# Patient Record
Sex: Male | Born: 1950
Health system: Southern US, Community
[De-identification: ages and names within clinical notes are randomized; demographics above are authoritative.]

## PROBLEM LIST (undated history)

## (undated) DIAGNOSIS — N189 Chronic kidney disease, unspecified: Secondary | ICD-10-CM

## (undated) DIAGNOSIS — T7840XA Allergy, unspecified, initial encounter: Secondary | ICD-10-CM

## (undated) DIAGNOSIS — E785 Hyperlipidemia, unspecified: Secondary | ICD-10-CM

## (undated) DIAGNOSIS — I10 Essential (primary) hypertension: Secondary | ICD-10-CM

## (undated) HISTORY — DX: Essential (primary) hypertension: I10

## (undated) HISTORY — DX: Chronic kidney disease, unspecified: N18.9

## (undated) HISTORY — PX: COLONOSCOPY: SHX174

## (undated) HISTORY — PX: TONSILLECTOMY: SUR1361

## (undated) HISTORY — DX: Hyperlipidemia, unspecified: E78.5

## (undated) HISTORY — DX: Allergy, unspecified, initial encounter: T78.40XA

## (undated) HISTORY — PX: OTHER SURGICAL HISTORY: SHX169

---

## 2005-10-01 ENCOUNTER — Ambulatory Visit: Payer: Self-pay | Admitting: Family Medicine

## 2006-05-07 ENCOUNTER — Ambulatory Visit: Payer: Self-pay | Admitting: Family Medicine

## 2006-05-21 ENCOUNTER — Ambulatory Visit: Payer: Self-pay | Admitting: Family Medicine

## 2006-06-14 ENCOUNTER — Ambulatory Visit: Payer: Self-pay | Admitting: Family Medicine

## 2007-08-27 ENCOUNTER — Ambulatory Visit: Payer: Self-pay | Admitting: Family Medicine

## 2007-08-29 LAB — CONVERTED CEMR LAB
ALT: 19 units/L (ref 0–53)
AST: 21 units/L (ref 0–37)
Albumin: 4 g/dL (ref 3.5–5.2)
Basophils Absolute: 0 10*3/uL (ref 0.0–0.1)
Calcium: 9.4 mg/dL (ref 8.4–10.5)
Chloride: 106 meq/L (ref 96–112)
Eosinophils Absolute: 0.2 10*3/uL (ref 0.0–0.6)
Eosinophils Relative: 2.4 % (ref 0.0–5.0)
GFR calc non Af Amer: 66 mL/min
LDL Cholesterol: 125 mg/dL — ABNORMAL HIGH (ref 0–99)
MCHC: 34.6 g/dL (ref 30.0–36.0)
MCV: 77.4 fL — ABNORMAL LOW (ref 78.0–100.0)
Platelets: 135 10*3/uL — ABNORMAL LOW (ref 150–400)
RBC: 5.43 M/uL (ref 4.22–5.81)
RDW: 15.2 % — ABNORMAL HIGH (ref 11.5–14.6)
Total CHOL/HDL Ratio: 5
Triglycerides: 46 mg/dL (ref 0–149)
WBC: 6.9 10*3/uL (ref 4.5–10.5)

## 2007-09-01 DIAGNOSIS — I10 Essential (primary) hypertension: Secondary | ICD-10-CM | POA: Insufficient documentation

## 2007-09-03 ENCOUNTER — Ambulatory Visit: Payer: Self-pay | Admitting: Family Medicine

## 2007-09-03 DIAGNOSIS — E785 Hyperlipidemia, unspecified: Secondary | ICD-10-CM

## 2008-11-24 ENCOUNTER — Ambulatory Visit: Payer: Self-pay | Admitting: Family Medicine

## 2008-11-24 DIAGNOSIS — M25569 Pain in unspecified knee: Secondary | ICD-10-CM | POA: Insufficient documentation

## 2009-01-04 ENCOUNTER — Ambulatory Visit: Payer: Self-pay | Admitting: Family Medicine

## 2009-01-04 LAB — CONVERTED CEMR LAB
Bilirubin Urine: NEGATIVE
Blood in Urine, dipstick: NEGATIVE
Ketones, urine, test strip: NEGATIVE
Protein, U semiquant: NEGATIVE
Urobilinogen, UA: 0.2

## 2009-01-10 LAB — CONVERTED CEMR LAB
Alkaline Phosphatase: 72 units/L (ref 39–117)
Bilirubin, Direct: 0.1 mg/dL (ref 0.0–0.3)
Cholesterol: 212 mg/dL (ref 0–200)
GFR calc Af Amer: 80 mL/min
GFR calc non Af Amer: 66 mL/min
Glucose, Bld: 106 mg/dL — ABNORMAL HIGH (ref 70–99)
HCT: 44.7 % (ref 39.0–52.0)
HDL: 40.6 mg/dL (ref >39.0)
Monocytes Absolute: 0.4 10*3/uL (ref 0.1–1.0)
Monocytes Relative: 6 % (ref 3.0–12.0)
Platelets: 125 10*3/uL — ABNORMAL LOW (ref 150–400)
Potassium: 4.8 meq/L (ref 3.5–5.1)
RDW: 15.2 % — ABNORMAL HIGH (ref 11.5–14.6)
Sodium: 142 meq/L (ref 135–145)
TSH: 2.71 microintl units/mL (ref 0.35–5.50)
Total CHOL/HDL Ratio: 5.2
Total Protein: 7.2 g/dL (ref 6.0–8.3)
Triglycerides: 97 mg/dL (ref 0–149)
VLDL: 19 mg/dL (ref 0–40)

## 2009-01-11 ENCOUNTER — Ambulatory Visit: Payer: Self-pay | Admitting: Family Medicine

## 2010-02-15 ENCOUNTER — Telehealth: Payer: Self-pay | Admitting: Family Medicine

## 2010-02-28 ENCOUNTER — Ambulatory Visit: Payer: Self-pay | Admitting: Family Medicine

## 2010-02-28 LAB — CONVERTED CEMR LAB
Bilirubin Urine: NEGATIVE
Blood in Urine, dipstick: NEGATIVE
Glucose, Urine, Semiquant: NEGATIVE
Ketones, urine, test strip: NEGATIVE
Specific Gravity, Urine: 1.01
pH: 5.5

## 2010-03-01 LAB — CONVERTED CEMR LAB
Albumin: 4.4 g/dL (ref 3.5–5.2)
Alkaline Phosphatase: 81 units/L (ref 39–117)
Basophils Absolute: 0 10*3/uL (ref 0.0–0.1)
Bilirubin, Direct: 0.1 mg/dL (ref 0.0–0.3)
CO2: 28 meq/L (ref 19–32)
Calcium: 9.1 mg/dL (ref 8.4–10.5)
Cholesterol: 154 mg/dL (ref 0–200)
Creatinine, Ser: 1.1 mg/dL (ref 0.4–1.5)
Eosinophils Absolute: 0.2 10*3/uL (ref 0.0–0.7)
Glucose, Bld: 96 mg/dL (ref 70–99)
HDL: 43.9 mg/dL (ref >39.00)
Lymphocytes Relative: 22.2 % (ref 12.0–46.0)
MCHC: 32.7 g/dL (ref 30.0–36.0)
MCV: 82.3 fL (ref 78.0–100.0)
Monocytes Absolute: 0.4 10*3/uL (ref 0.1–1.0)
Neutro Abs: 4.5 10*3/uL (ref 1.4–7.7)
Neutrophils Relative %: 68.7 % (ref 43.0–77.0)
PSA: 0.96 ng/mL (ref 0.10–4.00)
RDW: 14.3 % (ref 11.5–14.6)
Triglycerides: 83 mg/dL (ref 0.0–149.0)

## 2010-03-07 ENCOUNTER — Ambulatory Visit: Payer: Self-pay | Admitting: Family Medicine

## 2011-01-16 NOTE — Assessment & Plan Note (Signed)
Summary: CPX // RS   Vital Signs:  Patient profile:   60 year old male Weight:      241 pounds BMI:     35.72 BP sitting:   138 / 90  (left arm) Cuff size:   regular  Vitals Entered By: Raechel Ache, RN (March 07, 2010 2:53 PM) CC: CPX, labs done.   History of Present Illness: 60 yr old male for cpx. he feels good in general but has a few issues. First each spring his allergies act up and cause a lot of nasal congestion. He has tried nasal sprays in the past and wants to use this again. Also for one month has had patches of dry itchy skin on his arms.   Allergies (verified): No Known Drug Allergies  Past History:  Past Medical History: Reviewed history from 09/03/2007 and no changes required. Allergies Hypertension Hyperlipidemia  Past Surgical History: Reviewed history from 09/01/2007 and no changes required. Inguinal herniorrhaphy Tonsillectomy  Family History: Reviewed history from 09/01/2007 and no changes required. Family History Hypertension Family History of Respiratory disease  Social History: Reviewed history from 09/01/2007 and no changes required. Occupation: Married Never Smoked Alcohol use-no Drug use-no Regular exercise-no  Review of Systems  The patient denies anorexia, fever, weight loss, weight gain, vision loss, decreased hearing, hoarseness, chest pain, syncope, dyspnea on exertion, peripheral edema, prolonged cough, headaches, hemoptysis, abdominal pain, melena, hematochezia, severe indigestion/heartburn, hematuria, incontinence, genital sores, muscle weakness, suspicious skin lesions, transient blindness, difficulty walking, depression, unusual weight change, abnormal bleeding, enlarged lymph nodes, angioedema, breast masses, and testicular masses.    Physical Exam  General:  overweight-appearing.   Head:  Normocephalic and atraumatic without obvious abnormalities. No apparent alopecia or balding. Eyes:  No corneal or conjunctival  inflammation noted. EOMI. Perrla. Funduscopic exam benign, without hemorrhages, exudates or papilledema. Vision grossly normal. Ears:  External ear exam shows no significant lesions or deformities.  Otoscopic examination reveals clear canals, tympanic membranes are intact bilaterally without bulging, retraction, inflammation or discharge. Hearing is grossly normal bilaterally. Nose:  External nasal examination shows no deformity or inflammation. Nasal mucosa are pink and moist without lesions or exudates. Mouth:  Oral mucosa and oropharynx without lesions or exudates.  Teeth in good repair. Neck:  No deformities, masses, or tenderness noted. Chest Wall:  No deformities, masses, tenderness or gynecomastia noted. Lungs:  Normal respiratory effort, chest expands symmetrically. Lungs are clear to auscultation, no crackles or wheezes. Heart:  Normal rate and regular rhythm. S1 and S2 normal without gallop, murmur, click, rub or other extra sounds. EKG normal Abdomen:  Bowel sounds positive,abdomen soft and non-tender without masses, organomegaly or hernias noted. Rectal:  No external abnormalities noted. Normal sphincter tone. No rectal masses or tenderness. Heme neg. Genitalia:  Testes bilaterally descended without nodularity, tenderness or masses. No scrotal masses or lesions. No penis lesions or urethral discharge. Prostate:  Prostate gland firm and smooth, no enlargement, nodularity, tenderness, mass, asymmetry or induration. Msk:  No deformity or scoliosis noted of thoracic or lumbar spine.   Pulses:  R and L carotid,radial,femoral,dorsalis pedis and posterior tibial pulses are full and equal bilaterally Extremities:  No clubbing, cyanosis, edema, or deformity noted with normal full range of motion of all joints.   Neurologic:  No cranial nerve deficits noted. Station and gait are normal. Plantar reflexes are down-going bilaterally. DTRs are symmetrical throughout. Sensory, motor and coordinative  functions appear intact. Skin:  clear except a few macular patches of red scaly  skin as above Cervical Nodes:  No lymphadenopathy noted Inguinal Nodes:  No significant adenopathy Psych:  Cognition and judgment appear intact. Alert and cooperative with normal attention span and concentration. No apparent delusions, illusions, hallucinations   Impression & Recommendations:  Problem # 1:  EXAMINATION, ROUTINE MEDICAL (ICD-V70.0)  Orders: Hemoccult Guaiac-1 spec.(in office) (82270) EKG w/ Interpretation (93000)  Complete Medication List: 1)  Norvasc 2.5 Mg Tabs (Amlodipine besylate) .... Once daily 2)  Veramyst 27.5 Mcg/spray Susp (Fluticasone furoate) .... 2 sprays each nostril once daily  Other Orders: Tdap => 24yrs IM (95621) Admin 1st Vaccine (30865)  Patient Instructions: 1)  I again reccomended a colonoscopy but he declined due to the cost involved.  2)  It is important that you exercise reguarly at least 20 minutes 5 times a week. If you develop chest pain, have severe difficulty breathing, or feel very tired, stop exercising immediately and seek medical attention.  3)  You need to lose weight. Consider a lower calorie diet and regular exercise.  4)  Try Veramyst sprays. 5)  he has eczema on the arms. Try OTC hydrocortisone creams.  Prescriptions: VERAMYST 27.5 MCG/SPRAY SUSP (FLUTICASONE FUROATE) 2 sprays each nostril once daily  #30 x 11   Entered and Authorized by:   Nelwyn Salisbury MD   Signed by:   Nelwyn Salisbury MD on 03/07/2010   Method used:   Electronically to        [facility]. [phone]* (retail)       [phone] [address].       [place], [place]  [address]       Ph: [phone]       Fax: [phone]   RxID:   [id number] NORVASC 2.5 MG TABS (AMLODIPINE BESYLATE) once daily  #30 x 11   Entered and Authorized by:   Nelwyn Salisbury MD   Signed by:   Nelwyn Salisbury MD on 03/07/2010   Method used:   Electronically to        [facility]. [phone]* (retail)       [phone] [address].       [place], [place]  [address]       Ph: [phone]       Fax: [phone]   RxID:   [id number]    Immunizations Administered:  Tetanus Vaccine:    Vaccine Type: Tdap    Site: right deltoid    Mfr: GlaxoSmithKline    Dose: 0.5 ml    Route: IM    Given by: Raechel Ache, RN    Exp. Date: 03/09/2012    Lot #: [id number]    VIS given: 11/04/07 version given March 07, 2010.

## 2011-01-16 NOTE — Progress Notes (Signed)
Summary: REQ FOR MED REFILL  Phone Note Call from Patient   Caller: Patient [phone] Reason for Call: Refill Medication Summary of Call: Pt called to adv that he has appt scheduled for CPX on Tuesday - 03/07/2010 at 2:30pm..... This was the earliest that pt could schedule for CPX w/ Dr Clent Ridges...Marland KitchenMarland KitchenMarland Kitchen Pt adv that he will need refill on med: AMLODIPINE BESYLATE 2.5 MG TAB   sent into [facility] on [address] with enough of med to do him till his appt date/time.  Any questions or concerns, pt can be reached at [phone].  Initial call taken by: Debbra Riding,  February 15, 2010 4:11 PM  Follow-up for Phone Call        Phone Call Completed, Rx Called In Follow-up by: Alfred Levins, CMA,  February 15, 2010 4:31 PM    Prescriptions: NORVASC 2.5 MG TABS (AMLODIPINE BESYLATE) once daily  #30 Tablet x 0   Entered by:   Alfred Levins, CMA   Authorized by:   Nelwyn Salisbury MD   Signed by:   Alfred Levins, CMA on 02/15/2010   Method used:   Electronically to        [facility]. [phone]* (retail)       [phone].       [place], [place]  [address]       Ph: [phone]       Fax: [phone]   RxID:   [id number]

## 2011-04-09 ENCOUNTER — Other Ambulatory Visit: Payer: Self-pay | Admitting: Family Medicine

## 2011-07-31 ENCOUNTER — Telehealth: Payer: Self-pay | Admitting: Family Medicine

## 2011-07-31 NOTE — Telephone Encounter (Signed)
Pt has been off amLODipine (NORVASC) 2.5 MG tablet for about 4 months and will be in for a cpx in the beginning of September. Pt is requesting a refill to hold him over till his cpx.  [facility] [employer]

## 2011-08-01 NOTE — Telephone Encounter (Signed)
Call in #30 with one rf

## 2011-08-02 ENCOUNTER — Other Ambulatory Visit: Payer: Self-pay | Admitting: Family Medicine

## 2011-08-02 MED ORDER — AMLODIPINE BESYLATE 2.5 MG PO TABS: 2.5000 mg | ORAL_TABLET | Freq: Every day | ORAL | Status: DC

## 2011-08-02 NOTE — Telephone Encounter (Signed)
rx sent in electronically 

## 2011-08-16 ENCOUNTER — Other Ambulatory Visit (INDEPENDENT_AMBULATORY_CARE_PROVIDER_SITE_OTHER): Payer: BC Managed Care – PPO

## 2011-08-16 DIAGNOSIS — Z0000 Encounter for general adult medical examination without abnormal findings: Secondary | ICD-10-CM

## 2011-08-16 LAB — CBC WITH DIFFERENTIAL/PLATELET
Basophils Absolute: 0 10*3/uL (ref 0.0–0.1)
Eosinophils Absolute: 0.1 10*3/uL (ref 0.0–0.7)
HCT: 48.5 % (ref 39.0–52.0)
Hemoglobin: 15.6 g/dL (ref 13.0–17.0)
Lymphs Abs: 1.5 10*3/uL (ref 0.7–4.0)
MCHC: 32.2 g/dL (ref 30.0–36.0)
MCV: 81.2 fl (ref 78.0–100.0)
Monocytes Absolute: 0.5 10*3/uL (ref 0.1–1.0)
Neutro Abs: 5.6 10*3/uL (ref 1.4–7.7)
Platelets: 148 10*3/uL — ABNORMAL LOW (ref 150.0–400.0)
RDW: 15.9 % — ABNORMAL HIGH (ref 11.5–14.6)

## 2011-08-16 LAB — POCT URINALYSIS DIPSTICK
Blood, UA: NEGATIVE
Ketones, UA: NEGATIVE
Protein, UA: NEGATIVE
Spec Grav, UA: 1.015

## 2011-08-16 LAB — BASIC METABOLIC PANEL
BUN: 15 mg/dL (ref 6–23)
CO2: 30 mEq/L (ref 19–32)
Glucose, Bld: 108 mg/dL — ABNORMAL HIGH (ref 70–99)
Potassium: 5.6 mEq/L — ABNORMAL HIGH (ref 3.5–5.1)
Sodium: 145 mEq/L (ref 135–145)

## 2011-08-16 LAB — HEPATIC FUNCTION PANEL
AST: 27 U/L (ref 0–37)
Albumin: 4.3 g/dL (ref 3.5–5.2)

## 2011-08-16 LAB — LIPID PANEL
Cholesterol: 195 mg/dL (ref 0–200)
HDL: 46.5 mg/dL (ref >39.00)
Triglycerides: 88 mg/dL (ref 0.0–149.0)
VLDL: 17.6 mg/dL (ref 0.0–40.0)

## 2011-08-16 LAB — PSA: PSA: 0.83 ng/mL (ref 0.10–4.00)

## 2011-08-30 ENCOUNTER — Encounter: Payer: Self-pay | Admitting: Family Medicine

## 2011-08-31 ENCOUNTER — Ambulatory Visit (INDEPENDENT_AMBULATORY_CARE_PROVIDER_SITE_OTHER): Payer: BC Managed Care – PPO | Admitting: Family Medicine

## 2011-08-31 ENCOUNTER — Encounter: Payer: Self-pay | Admitting: Family Medicine

## 2011-08-31 VITALS — BP 136/84 | HR 58 | Temp 97.9°F | Ht 69.0 in | Wt 249.0 lb

## 2011-08-31 DIAGNOSIS — Z0000 Encounter for general adult medical examination without abnormal findings: Secondary | ICD-10-CM

## 2011-08-31 MED ORDER — FLUTICASONE PROPIONATE 50 MCG/ACT NA SUSP: 2.0000 | Freq: Every day | NASAL | Status: DC

## 2011-08-31 MED ORDER — AMLODIPINE BESYLATE 2.5 MG PO TABS: 2.5000 mg | ORAL_TABLET | Freq: Every day | ORAL | Status: DC

## 2011-08-31 NOTE — Progress Notes (Signed)
  Subjective:    Patient ID: Joseph Duncan, male    DOB: [birth year], 60 y.o.   MRN: [id number]  HPI 60 yr old male for a cpx. He feels well and has no complaints. He recently joined a gym and works out twice a week. He plays golf twice a week.    Review of Systems  Constitutional: Negative.   HENT: Negative.   Eyes: Negative.   Respiratory: Negative.   Cardiovascular: Negative.   Gastrointestinal: Negative.   Genitourinary: Negative.   Musculoskeletal: Negative.   Skin: Negative.   Neurological: Negative.   Hematological: Negative.   Psychiatric/Behavioral: Negative.        Objective:   Physical Exam  Constitutional: He is oriented to person, place, and time. He appears well-developed and well-nourished. No distress.  HENT:  Head: Normocephalic and atraumatic.  Right Ear: External ear normal.  Left Ear: External ear normal.  Nose: Nose normal.  Mouth/Throat: Oropharynx is clear and moist. No oropharyngeal exudate.  Eyes: Conjunctivae and EOM are normal. Pupils are equal, round, and reactive to light. Right eye exhibits no discharge. Left eye exhibits no discharge. No scleral icterus.  Neck: Neck supple. No JVD present. No tracheal deviation present. No thyromegaly present.  Cardiovascular: Normal rate, regular rhythm, normal heart sounds and intact distal pulses.  Exam reveals no gallop and no friction rub.   No murmur heard.      EKG normal   Pulmonary/Chest: Effort normal and breath sounds normal. No respiratory distress. He has no wheezes. He has no rales. He exhibits no tenderness.  Abdominal: Soft. Bowel sounds are normal. He exhibits no distension and no mass. There is no tenderness. There is no rebound and no guarding.  Genitourinary: Rectum normal, prostate normal and penis normal. Guaiac negative stool. No penile tenderness.  Musculoskeletal: Normal range of motion. He exhibits no edema and no tenderness.  Lymphadenopathy:    He has no cervical adenopathy.    Neurological: He is alert and oriented to person, place, and time. He has normal reflexes. No cranial nerve deficit. He exhibits normal muscle tone. Coordination normal.  Skin: Skin is warm and dry. No rash noted. He is not diaphoretic. No erythema. No pallor.  Psychiatric: He has a normal mood and affect. His behavior is normal. Judgment and thought content normal.          Assessment & Plan:  He needs to lose some weight. We will set up a colonoscopy

## 2012-08-11 ENCOUNTER — Telehealth: Payer: Self-pay | Admitting: Family Medicine

## 2012-08-11 MED ORDER — AMLODIPINE BESYLATE 2.5 MG PO TABS: 2.5000 mg | ORAL_TABLET | Freq: Every day | ORAL | Status: DC

## 2012-08-11 NOTE — Telephone Encounter (Signed)
I sent script e-scribe and spoke with pt. 

## 2012-08-11 NOTE — Telephone Encounter (Signed)
Pt called and has sch cpx in Oct. Has fasting labs 09/05/12. Pt is req fill of amLODipine (NORVASC) 2.5 MG tablet to [facility] on [address] [phone].

## 2012-09-05 ENCOUNTER — Other Ambulatory Visit (INDEPENDENT_AMBULATORY_CARE_PROVIDER_SITE_OTHER): Payer: BC Managed Care – PPO

## 2012-09-05 DIAGNOSIS — Z0000 Encounter for general adult medical examination without abnormal findings: Secondary | ICD-10-CM

## 2012-09-05 LAB — POCT URINALYSIS DIPSTICK
Bilirubin, UA: NEGATIVE
Glucose, UA: NEGATIVE
Ketones, UA: NEGATIVE
Leukocytes, UA: NEGATIVE
Nitrite, UA: NEGATIVE

## 2012-09-05 LAB — BASIC METABOLIC PANEL
BUN: 16 mg/dL (ref 6–23)
CO2: 29 mEq/L (ref 19–32)
Calcium: 9.8 mg/dL (ref 8.4–10.5)
Chloride: 103 mEq/L (ref 96–112)
Creatinine, Ser: 1.2 mg/dL (ref 0.4–1.5)

## 2012-09-05 LAB — CBC WITH DIFFERENTIAL/PLATELET
Eosinophils Absolute: 0.1 10*3/uL (ref 0.0–0.7)
Lymphocytes Relative: 18.9 % (ref 12.0–46.0)
MCHC: 32.3 g/dL (ref 30.0–36.0)
MCV: 79.4 fl (ref 78.0–100.0)
Monocytes Absolute: 0.5 10*3/uL (ref 0.1–1.0)
Neutrophils Relative %: 72.2 % (ref 43.0–77.0)
Platelets: 142 10*3/uL — ABNORMAL LOW (ref 150.0–400.0)
RBC: 6.11 Mil/uL — ABNORMAL HIGH (ref 4.22–5.81)
WBC: 7.1 10*3/uL (ref 4.5–10.5)

## 2012-09-05 LAB — HEPATIC FUNCTION PANEL
Alkaline Phosphatase: 82 U/L (ref 39–117)
Bilirubin, Direct: 0.2 mg/dL (ref 0.0–0.3)
Total Bilirubin: 1 mg/dL (ref 0.3–1.2)

## 2012-09-05 LAB — LIPID PANEL
Cholesterol: 187 mg/dL (ref 0–200)
HDL: 46.1 mg/dL (ref >39.00)
Triglycerides: 77 mg/dL (ref 0.0–149.0)
VLDL: 15.4 mg/dL (ref 0.0–40.0)

## 2012-09-05 LAB — PSA: PSA: 0.82 ng/mL (ref 0.10–4.00)

## 2012-09-08 NOTE — Progress Notes (Signed)
Quick Note:  I left voice message with results. ______ 

## 2012-09-24 ENCOUNTER — Ambulatory Visit (INDEPENDENT_AMBULATORY_CARE_PROVIDER_SITE_OTHER): Payer: BC Managed Care – PPO | Admitting: Family Medicine

## 2012-09-24 ENCOUNTER — Encounter: Payer: Self-pay | Admitting: Family Medicine

## 2012-09-24 VITALS — BP 130/88 | HR 82 | Temp 97.9°F | Ht 69.0 in | Wt 252.0 lb

## 2012-09-24 DIAGNOSIS — Z0000 Encounter for general adult medical examination without abnormal findings: Secondary | ICD-10-CM

## 2012-09-24 MED ORDER — FLUTICASONE PROPIONATE 50 MCG/ACT NA SUSP: 2.0000 | Freq: Every day | NASAL | Status: DC

## 2012-09-24 MED ORDER — AMLODIPINE BESYLATE 2.5 MG PO TABS: 2.5000 mg | ORAL_TABLET | Freq: Every day | ORAL | Status: DC

## 2012-09-24 NOTE — Progress Notes (Signed)
  Subjective:    Patient ID: Joseph Duncan, male    DOB: 1951-05-28, 60 y.o.   MRN: [id number]  HPI 61 yr old male for a cpx. He feels fine and has no concerns.    Review of Systems  Constitutional: Negative.   HENT: Negative.   Eyes: Negative.   Respiratory: Negative.   Cardiovascular: Negative.   Gastrointestinal: Negative.   Genitourinary: Negative.   Musculoskeletal: Negative.   Skin: Negative.   Neurological: Negative.   Hematological: Negative.   Psychiatric/Behavioral: Negative.        Objective:   Physical Exam  Constitutional: He is oriented to person, place, and time. He appears well-developed and well-nourished. No distress.  HENT:  Head: Normocephalic and atraumatic.  Right Ear: External ear normal.  Left Ear: External ear normal.  Nose: Nose normal.  Mouth/Throat: Oropharynx is clear and moist. No oropharyngeal exudate.  Eyes: Conjunctivae normal and EOM are normal. Pupils are equal, round, and reactive to light. Right eye exhibits no discharge. Left eye exhibits no discharge. No scleral icterus.  Neck: Neck supple. No JVD present. No tracheal deviation present. No thyromegaly present.  Cardiovascular: Normal rate, regular rhythm, normal heart sounds and intact distal pulses.  Exam reveals no gallop and no friction rub.   No murmur heard.      EKG normal with LAFB   Pulmonary/Chest: Effort normal and breath sounds normal. No respiratory distress. He has no wheezes. He has no rales. He exhibits no tenderness.  Abdominal: Soft. Bowel sounds are normal. He exhibits no distension and no mass. There is no tenderness. There is no rebound and no guarding.  Genitourinary: Rectum normal, prostate normal and penis normal. Guaiac negative stool. No penile tenderness.  Musculoskeletal: Normal range of motion. He exhibits no edema and no tenderness.  Lymphadenopathy:    He has no cervical adenopathy.  Neurological: He is alert and oriented to person, place, and time. He has  normal reflexes. No cranial nerve deficit. He exhibits normal muscle tone. Coordination normal.  Skin: Skin is warm and dry. No rash noted. He is not diaphoretic. No erythema. No pallor.  Psychiatric: He has a normal mood and affect. His behavior is normal. Judgment and thought content normal.          Assessment & Plan:  Well exam. We discussed his diet and his need to exercise and lose some weight. Once again I spent a long while advising him to get a colonoscopy, and he refuses to ever get one because he is convinced that the risk of harm or death from the procedure is too high.

## 2013-01-31 ENCOUNTER — Other Ambulatory Visit: Payer: Self-pay

## 2013-09-18 ENCOUNTER — Other Ambulatory Visit (INDEPENDENT_AMBULATORY_CARE_PROVIDER_SITE_OTHER): Payer: BC Managed Care – PPO

## 2013-09-18 DIAGNOSIS — Z0000 Encounter for general adult medical examination without abnormal findings: Secondary | ICD-10-CM

## 2013-09-18 LAB — BASIC METABOLIC PANEL
Chloride: 102 mEq/L (ref 96–112)
Potassium: 5.3 mEq/L — ABNORMAL HIGH (ref 3.5–5.1)

## 2013-09-18 LAB — HEPATIC FUNCTION PANEL
ALT: 21 U/L (ref 0–53)
Bilirubin, Direct: 0.1 mg/dL (ref 0.0–0.3)
Total Protein: 7 g/dL (ref 6.0–8.3)

## 2013-09-18 LAB — POCT URINALYSIS DIPSTICK
Bilirubin, UA: NEGATIVE
Blood, UA: NEGATIVE
Nitrite, UA: NEGATIVE
Spec Grav, UA: <=1.005
pH, UA: 5

## 2013-09-18 LAB — LIPID PANEL
HDL: 44.2 mg/dL (ref >39.00)
LDL Cholesterol: 109 mg/dL — ABNORMAL HIGH (ref 0–99)
Total CHOL/HDL Ratio: 4

## 2013-09-18 LAB — CBC WITH DIFFERENTIAL/PLATELET
Basophils Relative: 0.5 % (ref 0.0–3.0)
Eosinophils Relative: 1.6 % (ref 0.0–5.0)
HCT: 48.6 % (ref 39.0–52.0)
Lymphs Abs: 1.5 10*3/uL (ref 0.7–4.0)
MCV: 80.5 fl (ref 78.0–100.0)
Monocytes Absolute: 0.4 10*3/uL (ref 0.1–1.0)
RBC: 6.04 Mil/uL — ABNORMAL HIGH (ref 4.22–5.81)
WBC: 7.8 10*3/uL (ref 4.5–10.5)

## 2013-09-18 LAB — PSA: PSA: 0.94 ng/mL (ref 0.10–4.00)

## 2013-09-18 LAB — TSH: TSH: 2.86 u[IU]/mL (ref 0.35–5.50)

## 2013-09-18 NOTE — Progress Notes (Signed)
Quick Note:  Pt has appointment on 09/25/13 will go over then, also released results in my chart. ______

## 2013-09-25 ENCOUNTER — Ambulatory Visit (INDEPENDENT_AMBULATORY_CARE_PROVIDER_SITE_OTHER): Payer: BC Managed Care – PPO | Admitting: Family Medicine

## 2013-09-25 ENCOUNTER — Encounter: Payer: Self-pay | Admitting: Family Medicine

## 2013-09-25 VITALS — BP 130/82 | HR 66 | Temp 98.1°F | Ht 68.5 in | Wt 222.0 lb

## 2013-09-25 DIAGNOSIS — Z0000 Encounter for general adult medical examination without abnormal findings: Secondary | ICD-10-CM

## 2013-09-25 MED ORDER — FLUTICASONE PROPIONATE 50 MCG/ACT NA SUSP: 2.0000 | Freq: Every day | NASAL | Status: DC

## 2013-09-25 NOTE — Progress Notes (Signed)
  Subjective:    Patient ID: Joseph Duncan, male    DOB: [birth year], 62 y.o.   MRN: [id number]  HPI 62 yr old male for a cpx. He feels great. He has lost 30 lbs in the past year by changing his diet, and he stopped taking his BP pill 3 months ago. His BP has been stable.    Review of Systems  Constitutional: Negative.   HENT: Negative.   Eyes: Negative.   Respiratory: Negative.   Cardiovascular: Negative.   Gastrointestinal: Negative.   Genitourinary: Negative.   Musculoskeletal: Negative.   Skin: Negative.   Neurological: Negative.   Psychiatric/Behavioral: Negative.        Objective:   Physical Exam  Constitutional: He is oriented to person, place, and time. He appears well-developed and well-nourished. No distress.  HENT:  Head: Normocephalic and atraumatic.  Right Ear: External ear normal.  Left Ear: External ear normal.  Nose: Nose normal.  Mouth/Throat: Oropharynx is clear and moist. No oropharyngeal exudate.  Eyes: Conjunctivae and EOM are normal. Pupils are equal, round, and reactive to light. Right eye exhibits no discharge. Left eye exhibits no discharge. No scleral icterus.  Neck: Neck supple. No JVD present. No tracheal deviation present. No thyromegaly present.  Cardiovascular: Normal rate, regular rhythm, normal heart sounds and intact distal pulses.  Exam reveals no gallop and no friction rub.   No murmur heard. EKG normal with LAFB   Pulmonary/Chest: Effort normal and breath sounds normal. No respiratory distress. He has no wheezes. He has no rales. He exhibits no tenderness.  Abdominal: Soft. Bowel sounds are normal. He exhibits no distension and no mass. There is no tenderness. There is no rebound and no guarding.  Genitourinary: Rectum normal, prostate normal and penis normal. Guaiac negative stool. No penile tenderness.  Musculoskeletal: Normal range of motion. He exhibits no edema and no tenderness.  Lymphadenopathy:    He has no cervical adenopathy.   Neurological: He is alert and oriented to person, place, and time. He has normal reflexes. No cranial nerve deficit. He exhibits normal muscle tone. Coordination normal.  Skin: Skin is warm and dry. No rash noted. He is not diaphoretic. No erythema. No pallor.  Psychiatric: He has a normal mood and affect. His behavior is normal. Judgment and thought content normal.          Assessment & Plan:  Well exam. We will stay off BP meds. Recheck one year.

## 2013-09-26 ENCOUNTER — Other Ambulatory Visit: Payer: Self-pay | Admitting: Family Medicine

## 2013-09-29 ENCOUNTER — Other Ambulatory Visit: Payer: Self-pay

## 2014-09-29 ENCOUNTER — Other Ambulatory Visit (INDEPENDENT_AMBULATORY_CARE_PROVIDER_SITE_OTHER): Payer: BC Managed Care – PPO

## 2014-09-29 ENCOUNTER — Telehealth: Payer: Self-pay | Admitting: *Deleted

## 2014-09-29 ENCOUNTER — Other Ambulatory Visit: Payer: Self-pay | Admitting: Family Medicine

## 2014-09-29 DIAGNOSIS — E875 Hyperkalemia: Secondary | ICD-10-CM

## 2014-09-29 DIAGNOSIS — Z0000 Encounter for general adult medical examination without abnormal findings: Secondary | ICD-10-CM

## 2014-09-29 LAB — CBC WITH DIFFERENTIAL/PLATELET
BASOS PCT: 0.4 % (ref 0.0–3.0)
Basophils Absolute: 0 10*3/uL (ref 0.0–0.1)
EOS PCT: 2.6 % (ref 0.0–5.0)
Eosinophils Absolute: 0.2 10*3/uL (ref 0.0–0.7)
HEMATOCRIT: 49.1 % (ref 39.0–52.0)
Hemoglobin: 15.9 g/dL (ref 13.0–17.0)
Lymphocytes Relative: 24.9 % (ref 12.0–46.0)
Lymphs Abs: 1.7 10*3/uL (ref 0.7–4.0)
MCHC: 32.3 g/dL (ref 30.0–36.0)
MCV: 80.9 fl (ref 78.0–100.0)
Monocytes Absolute: 0.4 10*3/uL (ref 0.1–1.0)
Monocytes Relative: 6 % (ref 3.0–12.0)
Neutro Abs: 4.4 10*3/uL (ref 1.4–7.7)
Neutrophils Relative %: 66.1 % (ref 43.0–77.0)
Platelets: 130 10*3/uL — ABNORMAL LOW (ref 150.0–400.0)
RBC: 6.07 Mil/uL — AB (ref 4.22–5.81)
RDW: 15.9 % — ABNORMAL HIGH (ref 11.5–15.5)
WBC: 6.7 10*3/uL (ref 4.0–10.5)

## 2014-09-29 LAB — LIPID PANEL
Cholesterol: 214 mg/dL — ABNORMAL HIGH (ref 0–200)
HDL: 45.5 mg/dL (ref >39.00)
LDL Cholesterol: 154 mg/dL — ABNORMAL HIGH (ref 0–99)
NonHDL: 168.5
TRIGLYCERIDES: 74 mg/dL (ref 0.0–149.0)
Total CHOL/HDL Ratio: 5
VLDL: 14.8 mg/dL (ref 0.0–40.0)

## 2014-09-29 LAB — POCT URINALYSIS DIPSTICK
Bilirubin, UA: NEGATIVE
Glucose, UA: NEGATIVE
KETONES UA: NEGATIVE
Leukocytes, UA: NEGATIVE
NITRITE UA: NEGATIVE
Protein, UA: NEGATIVE
RBC UA: NEGATIVE
Spec Grav, UA: 1.01
Urobilinogen, UA: 0.2
pH, UA: 6.5

## 2014-09-29 LAB — HEPATIC FUNCTION PANEL
ALK PHOS: 73 U/L (ref 39–117)
ALT: 22 U/L (ref 0–53)
AST: 20 U/L (ref 0–37)
Albumin: 3.8 g/dL (ref 3.5–5.2)
BILIRUBIN DIRECT: 0.1 mg/dL (ref 0.0–0.3)
Total Bilirubin: 0.8 mg/dL (ref 0.2–1.2)
Total Protein: 7.4 g/dL (ref 6.0–8.3)

## 2014-09-29 LAB — BASIC METABOLIC PANEL
BUN: 26 mg/dL — ABNORMAL HIGH (ref 6–23)
CHLORIDE: 104 meq/L (ref 96–112)
CO2: 31 mEq/L (ref 19–32)
Calcium: 9.7 mg/dL (ref 8.4–10.5)
Creatinine, Ser: 1.2 mg/dL (ref 0.4–1.5)
GFR: 67.21 mL/min (ref >60.00)
Glucose, Bld: 103 mg/dL — ABNORMAL HIGH (ref 70–99)
POTASSIUM: 6.1 meq/L — AB (ref 3.5–5.1)
SODIUM: 140 meq/L (ref 135–145)

## 2014-09-29 LAB — TSH: TSH: 3.16 u[IU]/mL (ref 0.35–4.50)

## 2014-09-29 LAB — PSA: PSA: 0.73 ng/mL (ref 0.10–4.00)

## 2014-09-29 NOTE — Telephone Encounter (Signed)
Tonya called from the lab with a critical lab--Potassium 6.1--message forwarded to Dr Sarajane Jews.

## 2014-09-30 ENCOUNTER — Other Ambulatory Visit (INDEPENDENT_AMBULATORY_CARE_PROVIDER_SITE_OTHER): Payer: BC Managed Care – PPO

## 2014-09-30 DIAGNOSIS — E875 Hyperkalemia: Secondary | ICD-10-CM

## 2014-09-30 LAB — BASIC METABOLIC PANEL
BUN: 26 mg/dL — ABNORMAL HIGH (ref 6–23)
CHLORIDE: 101 meq/L (ref 96–112)
CO2: 25 meq/L (ref 19–32)
CREATININE: 1.3 mg/dL (ref 0.4–1.5)
Calcium: 9.6 mg/dL (ref 8.4–10.5)
GFR: 58.93 mL/min — ABNORMAL LOW (ref >60.00)
Glucose, Bld: 85 mg/dL (ref 70–99)
Potassium: 5.9 mEq/L — ABNORMAL HIGH (ref 3.5–5.1)
Sodium: 136 mEq/L (ref 135–145)

## 2014-09-30 NOTE — Telephone Encounter (Signed)
Tell him to drink plenty of water and he needs to come in for a repeat BMET

## 2014-09-30 NOTE — Telephone Encounter (Signed)
I spoke with pt and he is on the lab schedule for today 09/29/14.

## 2014-10-06 ENCOUNTER — Encounter: Payer: Self-pay | Admitting: Family Medicine

## 2014-10-06 ENCOUNTER — Ambulatory Visit (INDEPENDENT_AMBULATORY_CARE_PROVIDER_SITE_OTHER): Payer: BC Managed Care – PPO | Admitting: Family Medicine

## 2014-10-06 VITALS — BP 163/88 | HR 52 | Temp 98.5°F | Ht 68.5 in | Wt 218.0 lb

## 2014-10-06 DIAGNOSIS — N289 Disorder of kidney and ureter, unspecified: Secondary | ICD-10-CM

## 2014-10-06 DIAGNOSIS — Z0000 Encounter for general adult medical examination without abnormal findings: Secondary | ICD-10-CM

## 2014-10-06 NOTE — Progress Notes (Signed)
   Subjective:    Patient ID: Joseph Duncan, male    DOB: March 05, 1951, 63 y.o.   MRN: [id number]  HPI 63 yr old male for a cpx. He feels well. On his cpx labs he had a potassium of 6.1 which we repeated at 5.9. Also his GFR has declined slightly to 59.    Review of Systems  Constitutional: Negative.   HENT: Negative.   Eyes: Negative.   Respiratory: Negative.   Cardiovascular: Negative.   Gastrointestinal: Negative.   Genitourinary: Negative.   Musculoskeletal: Negative.   Skin: Negative.   Neurological: Negative.   Psychiatric/Behavioral: Negative.        Objective:   Physical Exam  Constitutional: He is oriented to person, place, and time. He appears well-developed and well-nourished. No distress.  HENT:  Head: Normocephalic and atraumatic.  Right Ear: External ear normal.  Left Ear: External ear normal.  Nose: Nose normal.  Mouth/Throat: Oropharynx is clear and moist. No oropharyngeal exudate.  Eyes: Conjunctivae and EOM are normal. Pupils are equal, round, and reactive to light. Right eye exhibits no discharge. Left eye exhibits no discharge. No scleral icterus.  Neck: Neck supple. No JVD present. No tracheal deviation present. No thyromegaly present.  Cardiovascular: Normal rate, regular rhythm, normal heart sounds and intact distal pulses.  Exam reveals no gallop and no friction rub.   No murmur heard. EKG normal   Pulmonary/Chest: Effort normal and breath sounds normal. No respiratory distress. He has no wheezes. He has no rales. He exhibits no tenderness.  Abdominal: Soft. Bowel sounds are normal. He exhibits no distension and no mass. There is no tenderness. There is no rebound and no guarding.  Genitourinary: Rectum normal, prostate normal and penis normal. Guaiac negative stool. No penile tenderness.  Musculoskeletal: Normal range of motion. He exhibits no edema and no tenderness.  Lymphadenopathy:    He has no cervical adenopathy.  Neurological: He is alert and  oriented to person, place, and time. He has normal reflexes. No cranial nerve deficit. He exhibits normal muscle tone. Coordination normal.  Skin: Skin is warm and dry. No rash noted. He is not diaphoretic. No erythema. No pallor.  Psychiatric: He has a normal mood and affect. His behavior is normal. Judgment and thought content normal.          Assessment & Plan:  Well exam. We will refer to Nephrology to evaluate his renal function.

## 2014-10-06 NOTE — Progress Notes (Signed)
Pre visit review using our clinic review tool, if applicable. No additional management support is needed unless otherwise documented below in the visit note. 

## 2014-10-19 ENCOUNTER — Telehealth: Payer: Self-pay | Admitting: Family Medicine

## 2014-10-19 NOTE — Telephone Encounter (Signed)
Pt is no longer taking bp meds. Pt has been taking his blood pressure for the past week and bp reading have been Fri am  117/66  mon morning :  120/70 In the afternoon, bp readings have been: Fri:  171/88  // Mon: 150/80 Weekend was fine. Pt wanted dr to know. He thinks bp is going up in the evenings.  pls advise

## 2014-10-20 NOTE — Telephone Encounter (Signed)
Lets just continue to monitor the BP daily and follow up with me in one month

## 2014-10-21 NOTE — Telephone Encounter (Signed)
I spoke with pt  

## 2014-12-20 DIAGNOSIS — I10 Essential (primary) hypertension: Secondary | ICD-10-CM | POA: Diagnosis not present

## 2014-12-20 DIAGNOSIS — E875 Hyperkalemia: Secondary | ICD-10-CM | POA: Diagnosis not present

## 2015-01-05 DIAGNOSIS — I10 Essential (primary) hypertension: Secondary | ICD-10-CM | POA: Diagnosis not present

## 2015-02-11 DIAGNOSIS — E875 Hyperkalemia: Secondary | ICD-10-CM | POA: Diagnosis not present

## 2015-02-15 DIAGNOSIS — I10 Essential (primary) hypertension: Secondary | ICD-10-CM | POA: Diagnosis not present

## 2015-11-18 ENCOUNTER — Ambulatory Visit (INDEPENDENT_AMBULATORY_CARE_PROVIDER_SITE_OTHER): Payer: Medicare Other | Admitting: Family Medicine

## 2015-11-18 ENCOUNTER — Encounter: Payer: Self-pay | Admitting: Family Medicine

## 2015-11-18 VITALS — BP 153/94 | HR 54 | Temp 97.8°F | Ht 68.5 in | Wt 235.0 lb

## 2015-11-18 DIAGNOSIS — Z125 Encounter for screening for malignant neoplasm of prostate: Secondary | ICD-10-CM

## 2015-11-18 DIAGNOSIS — I10 Essential (primary) hypertension: Secondary | ICD-10-CM

## 2015-11-18 DIAGNOSIS — Z0000 Encounter for general adult medical examination without abnormal findings: Secondary | ICD-10-CM | POA: Diagnosis not present

## 2015-11-18 LAB — POCT URINALYSIS DIPSTICK
Bilirubin, UA: NEGATIVE
GLUCOSE UA: NEGATIVE
Ketones, UA: NEGATIVE
Leukocytes, UA: NEGATIVE
NITRITE UA: NEGATIVE
PH UA: 7
Protein, UA: NEGATIVE
RBC UA: NEGATIVE
SPEC GRAV UA: 1.02
UROBILINOGEN UA: 0.2

## 2015-11-18 LAB — CBC WITH DIFFERENTIAL/PLATELET
BASOS ABS: 0 10*3/uL (ref 0.0–0.1)
BASOS PCT: 0.5 % (ref 0.0–3.0)
Eosinophils Absolute: 0.1 10*3/uL (ref 0.0–0.7)
Eosinophils Relative: 1.4 % (ref 0.0–5.0)
HEMATOCRIT: 50.7 % (ref 39.0–52.0)
HEMOGLOBIN: 16.6 g/dL (ref 13.0–17.0)
LYMPHS PCT: 20.7 % (ref 12.0–46.0)
Lymphs Abs: 1.5 10*3/uL (ref 0.7–4.0)
MCHC: 32.8 g/dL (ref 30.0–36.0)
MCV: 81 fl (ref 78.0–100.0)
MONOS PCT: 5.1 % (ref 3.0–12.0)
Monocytes Absolute: 0.4 10*3/uL (ref 0.1–1.0)
NEUTROS ABS: 5.4 10*3/uL (ref 1.4–7.7)
Neutrophils Relative %: 72.3 % (ref 43.0–77.0)
PLATELETS: 128 10*3/uL — AB (ref 150.0–400.0)
RBC: 6.26 Mil/uL — ABNORMAL HIGH (ref 4.22–5.81)
RDW: 16 % — AB (ref 11.5–15.5)
WBC: 7.4 10*3/uL (ref 4.0–10.5)

## 2015-11-18 LAB — BASIC METABOLIC PANEL
BUN: 27 mg/dL — AB (ref 6–23)
CALCIUM: 9.9 mg/dL (ref 8.4–10.5)
CO2: 29 meq/L (ref 19–32)
CREATININE: 1.18 mg/dL (ref 0.40–1.50)
Chloride: 100 mEq/L (ref 96–112)
GFR: 65.66 mL/min (ref >60.00)
GLUCOSE: 97 mg/dL (ref 70–99)
Potassium: 4 mEq/L (ref 3.5–5.1)
Sodium: 139 mEq/L (ref 135–145)

## 2015-11-18 LAB — HEPATIC FUNCTION PANEL
ALBUMIN: 4.3 g/dL (ref 3.5–5.2)
ALT: 22 U/L (ref 0–53)
AST: 18 U/L (ref 0–37)
Alkaline Phosphatase: 80 U/L (ref 39–117)
Bilirubin, Direct: 0.1 mg/dL (ref 0.0–0.3)
TOTAL PROTEIN: 7.4 g/dL (ref 6.0–8.3)
Total Bilirubin: 0.7 mg/dL (ref 0.2–1.2)

## 2015-11-18 LAB — TSH: TSH: 3.09 u[IU]/mL (ref 0.35–4.50)

## 2015-11-18 LAB — LIPID PANEL
CHOLESTEROL: 194 mg/dL (ref 0–200)
HDL: 43.4 mg/dL (ref >39.00)
LDL CALC: 136 mg/dL — AB (ref 0–99)
NonHDL: 150.22
Total CHOL/HDL Ratio: 4
Triglycerides: 72 mg/dL (ref 0.0–149.0)
VLDL: 14.4 mg/dL (ref 0.0–40.0)

## 2015-11-18 LAB — PSA: PSA: 1.12 ng/mL (ref 0.10–4.00)

## 2015-11-18 MED ORDER — CHLORTHALIDONE 25 MG PO TABS: 25.0000 mg | ORAL_TABLET | Freq: Every day | ORAL | Status: DC

## 2015-11-18 NOTE — Progress Notes (Signed)
   Subjective:    Patient ID: Joseph Duncan, male    DOB: 11/05/1951, 64 y.o.   MRN: [id number]  HPI 64 yr old male for a cpx. He feels well. He saw Dr. Joelyn Oms last January for mild renal insufficiency and hyperkalemia. He was started on Clorthalidone for these issues and he as done well. His BP at home is always 100-110 over 70-80.    Review of Systems  Constitutional: Negative.   HENT: Negative.   Eyes: Negative.   Respiratory: Negative.   Cardiovascular: Negative.   Gastrointestinal: Negative.   Genitourinary: Negative.   Musculoskeletal: Negative.   Skin: Negative.   Neurological: Negative.   Psychiatric/Behavioral: Negative.        Objective:   Physical Exam  Constitutional: He is oriented to person, place, and time. He appears well-developed and well-nourished. No distress.  HENT:  Head: Normocephalic and atraumatic.  Right Ear: External ear normal.  Left Ear: External ear normal.  Nose: Nose normal.  Mouth/Throat: Oropharynx is clear and moist. No oropharyngeal exudate.  Eyes: Conjunctivae and EOM are normal. Pupils are equal, round, and reactive to light. Right eye exhibits no discharge. Left eye exhibits no discharge. No scleral icterus.  Neck: Neck supple. No JVD present. No tracheal deviation present. No thyromegaly present.  Cardiovascular: Normal rate, regular rhythm, normal heart sounds and intact distal pulses.  Exam reveals no gallop and no friction rub.   No murmur heard. EKG normal   Pulmonary/Chest: Effort normal and breath sounds normal. No respiratory distress. He has no wheezes. He has no rales. He exhibits no tenderness.  Abdominal: Soft. Bowel sounds are normal. He exhibits no distension and no mass. There is no tenderness. There is no rebound and no guarding.  Genitourinary: Rectum normal, prostate normal and penis normal. Guaiac negative stool. No penile tenderness.  Musculoskeletal: Normal range of motion. He exhibits no edema or tenderness.    Lymphadenopathy:    He has no cervical adenopathy.  Neurological: He is alert and oriented to person, place, and time. He has normal reflexes. No cranial nerve deficit. He exhibits normal muscle tone. Coordination normal.  Skin: Skin is warm and dry. No rash noted. He is not diaphoretic. No erythema. No pallor.  Psychiatric: He has a normal mood and affect. His behavior is normal. Judgment and thought content normal.          Assessment & Plan:  Well exam. We discussed diet and exercise. Get fasting labs today

## 2015-11-18 NOTE — Progress Notes (Signed)
Pre visit review using our clinic review tool, if applicable. No additional management support is needed unless otherwise documented below in the visit note. 

## 2015-11-19 ENCOUNTER — Other Ambulatory Visit: Payer: Self-pay | Admitting: Family Medicine

## 2016-10-11 DIAGNOSIS — D3132 Benign neoplasm of left choroid: Secondary | ICD-10-CM | POA: Diagnosis not present

## 2016-11-03 ENCOUNTER — Other Ambulatory Visit: Payer: Self-pay | Admitting: Family Medicine

## 2016-11-20 ENCOUNTER — Ambulatory Visit (INDEPENDENT_AMBULATORY_CARE_PROVIDER_SITE_OTHER): Payer: Medicare Other | Admitting: Family Medicine

## 2016-11-20 ENCOUNTER — Encounter: Payer: Self-pay | Admitting: Family Medicine

## 2016-11-20 VITALS — BP 148/93 | HR 54 | Temp 97.9°F | Ht 68.5 in | Wt 248.0 lb

## 2016-11-20 DIAGNOSIS — Z0000 Encounter for general adult medical examination without abnormal findings: Secondary | ICD-10-CM | POA: Diagnosis not present

## 2016-11-20 LAB — HEPATIC FUNCTION PANEL
ALK PHOS: 65 U/L (ref 39–117)
ALT: 31 U/L (ref 0–53)
AST: 23 U/L (ref 0–37)
Albumin: 4.2 g/dL (ref 3.5–5.2)
BILIRUBIN DIRECT: 0.2 mg/dL (ref 0.0–0.3)
TOTAL PROTEIN: 6.9 g/dL (ref 6.0–8.3)
Total Bilirubin: 1 mg/dL (ref 0.2–1.2)

## 2016-11-20 LAB — CBC WITH DIFFERENTIAL/PLATELET
BASOS PCT: 0.4 % (ref 0.0–3.0)
Basophils Absolute: 0 10*3/uL (ref 0.0–0.1)
EOS ABS: 0.1 10*3/uL (ref 0.0–0.7)
Eosinophils Relative: 2.2 % (ref 0.0–5.0)
HCT: 49.4 % (ref 39.0–52.0)
Hemoglobin: 16.3 g/dL (ref 13.0–17.0)
LYMPHS ABS: 1.5 10*3/uL (ref 0.7–4.0)
Lymphocytes Relative: 23.2 % (ref 12.0–46.0)
MCHC: 33.1 g/dL (ref 30.0–36.0)
MCV: 80.7 fl (ref 78.0–100.0)
MONO ABS: 0.4 10*3/uL (ref 0.1–1.0)
Monocytes Relative: 6.3 % (ref 3.0–12.0)
NEUTROS ABS: 4.4 10*3/uL (ref 1.4–7.7)
NEUTROS PCT: 67.9 % (ref 43.0–77.0)
PLATELETS: 144 10*3/uL — AB (ref 150.0–400.0)
RBC: 6.11 Mil/uL — ABNORMAL HIGH (ref 4.22–5.81)
RDW: 17.1 % — AB (ref 11.5–15.5)
WBC: 6.5 10*3/uL (ref 4.0–10.5)

## 2016-11-20 LAB — PSA: PSA: 1.18 ng/mL (ref 0.10–4.00)

## 2016-11-20 LAB — LIPID PANEL
CHOLESTEROL: 185 mg/dL (ref 0–200)
HDL: 47.3 mg/dL (ref >39.00)
LDL Cholesterol: 117 mg/dL — ABNORMAL HIGH (ref 0–99)
NonHDL: 138.11
TRIGLYCERIDES: 104 mg/dL (ref 0.0–149.0)
Total CHOL/HDL Ratio: 4
VLDL: 20.8 mg/dL (ref 0.0–40.0)

## 2016-11-20 LAB — BASIC METABOLIC PANEL
BUN: 18 mg/dL (ref 6–23)
CO2: 29 meq/L (ref 19–32)
Calcium: 9.7 mg/dL (ref 8.4–10.5)
Chloride: 101 mEq/L (ref 96–112)
Creatinine, Ser: 1.22 mg/dL (ref 0.40–1.50)
GFR: 62.99 mL/min (ref >60.00)
GLUCOSE: 102 mg/dL — AB (ref 70–99)
POTASSIUM: 3.7 meq/L (ref 3.5–5.1)
SODIUM: 139 meq/L (ref 135–145)

## 2016-11-20 LAB — TSH: TSH: 2.32 u[IU]/mL (ref 0.35–4.50)

## 2016-11-20 MED ORDER — CHLORTHALIDONE 25 MG PO TABS: 25.0000 mg | ORAL_TABLET | Freq: Every day | ORAL | 3 refills | Status: DC

## 2016-11-20 NOTE — Progress Notes (Signed)
   Subjective:    Patient ID: Joseph Duncan, male    DOB: Dec 21, 1950, 65 y.o.   MRN: [id number]  HPI 65 yr old male for a well exam. He feels fine.    Review of Systems  Constitutional: Negative.   HENT: Negative.   Eyes: Negative.   Respiratory: Negative.   Cardiovascular: Negative.   Gastrointestinal: Negative.   Genitourinary: Negative.   Musculoskeletal: Negative.   Skin: Negative.   Neurological: Negative.   Psychiatric/Behavioral: Negative.        Objective:   Physical Exam  Constitutional: He is oriented to person, place, and time. He appears well-developed and well-nourished. No distress.  HENT:  Head: Normocephalic and atraumatic.  Right Ear: External ear normal.  Left Ear: External ear normal.  Nose: Nose normal.  Mouth/Throat: Oropharynx is clear and moist. No oropharyngeal exudate.  Eyes: Conjunctivae and EOM are normal. Pupils are equal, round, and reactive to light. Right eye exhibits no discharge. Left eye exhibits no discharge. No scleral icterus.  Neck: Neck supple. No JVD present. No tracheal deviation present. No thyromegaly present.  Cardiovascular: Normal rate, regular rhythm, normal heart sounds and intact distal pulses.  Exam reveals no gallop and no friction rub.   No murmur heard. Pulmonary/Chest: Effort normal and breath sounds normal. No respiratory distress. He has no wheezes. He has no rales. He exhibits no tenderness.  Abdominal: Soft. Bowel sounds are normal. He exhibits no distension and no mass. There is no tenderness. There is no rebound and no guarding.  Genitourinary: Rectum normal, prostate normal and penis normal. Rectal exam shows guaiac negative stool. No penile tenderness.  Musculoskeletal: Normal range of motion. He exhibits no edema or tenderness.  Lymphadenopathy:    He has no cervical adenopathy.  Neurological: He is alert and oriented to person, place, and time. He has normal reflexes. No cranial nerve deficit. He exhibits  normal muscle tone. Coordination normal.  Skin: Skin is warm and dry. No rash noted. He is not diaphoretic. No erythema. No pallor.  Psychiatric: He has a normal mood and affect. His behavior is normal. Judgment and thought content normal.          Assessment & Plan:  Well exam. We discussed diet and exercise. Get fasting labs today.  Laurey Morale, MD

## 2016-11-20 NOTE — Progress Notes (Signed)
Pre visit review using our clinic review tool, if applicable. No additional management support is needed unless otherwise documented below in the visit note. 

## 2017-02-25 ENCOUNTER — Ambulatory Visit (INDEPENDENT_AMBULATORY_CARE_PROVIDER_SITE_OTHER): Payer: Medicare Other | Admitting: Family Medicine

## 2017-02-25 ENCOUNTER — Encounter: Payer: Self-pay | Admitting: Family Medicine

## 2017-02-25 VITALS — BP 150/98 | HR 75 | Temp 97.6°F | Ht 68.5 in | Wt 251.0 lb

## 2017-02-25 DIAGNOSIS — D171 Benign lipomatous neoplasm of skin and subcutaneous tissue of trunk: Secondary | ICD-10-CM | POA: Diagnosis not present

## 2017-02-25 DIAGNOSIS — R109 Unspecified abdominal pain: Secondary | ICD-10-CM

## 2017-02-25 NOTE — Progress Notes (Signed)
   Subjective:    Patient ID: Joseph Duncan, male    DOB: 03-Apr-1951, 66 y.o.   MRN: [id number]  HPI Here for one month of recurrent right flank pains. These only bother him at night. He usually wakes up during the night to go to the bathroom, and when he does wake up he feels okay at first. Then when he turns over and gets up out of the bed the pain starts. This is sharp in nature and it is positional. Twisting or bending can make it worse. He then uses the bathroom and lies back down. When lying still the pain quickly goes away and he goes back to sleep. He never has pain during the day. No change in urinations or BMs. No fever or nausea.    Review of Systems  Constitutional: Negative.   Respiratory: Negative.   Cardiovascular: Negative.   Gastrointestinal: Negative.   Genitourinary: Positive for flank pain. Negative for dysuria, frequency, hematuria and urgency.  Neurological: Negative.        Objective:   Physical Exam  Constitutional: He appears well-developed and well-nourished. No distress.  Cardiovascular: Normal rate, regular rhythm, normal heart sounds and intact distal pulses.   Pulmonary/Chest: Effort normal and breath sounds normal.  Abdominal: Soft. Bowel sounds are normal. He exhibits no distension. There is no rebound and no guarding.  He is mildly tender over the right flank just inferior to the ribs. There is a firm smooth mass just under the skin about 6 cm in diameter in this area           Assessment & Plan:  Abdominal wall mass, possible lipoma. We will set up an MRI of the abdomen to evaluate.  Alysia Penna, MD

## 2017-02-25 NOTE — Progress Notes (Signed)
Pre visit review using our clinic review tool, if applicable. No additional management support is needed unless otherwise documented below in the visit note. 

## 2017-02-25 NOTE — Patient Instructions (Signed)
WE NOW OFFER   [facility] FAST TRACK!!!  SAME DAY Appointments for ACUTE CARE  Such as: Sprains, Injuries, cuts, abrasions, rashes, muscle pain, joint pain, back pain Colds, flu, sore throats, headache, allergies, cough, fever  Ear pain, sinus and eye infections Abdominal pain, nausea, vomiting, diarrhea, upset stomach Animal/insect bites  3 Easy Ways to Schedule: Walk-In Scheduling Call in scheduling Mychart Sign-up: [email or web address]         

## 2017-03-14 ENCOUNTER — Ambulatory Visit
Admission: RE | Admit: 2017-03-14 | Discharge: 2017-03-14 | Disposition: A | Discharge status: Home / Self Care | Payer: Medicare Other | Source: Ambulatory Visit | Attending: Family Medicine | Admitting: Family Medicine

## 2017-03-14 DIAGNOSIS — R1031 Right lower quadrant pain: Secondary | ICD-10-CM | POA: Diagnosis not present

## 2017-03-14 DIAGNOSIS — R109 Unspecified abdominal pain: Secondary | ICD-10-CM

## 2017-03-14 MED ORDER — GADOBENATE DIMEGLUMINE 529 MG/ML IV SOLN
20.0000 mL | Freq: Once | INTRAVENOUS | Status: AC | PRN
  Administered 2017-03-14: 20 mL via INTRAVENOUS

## 2017-03-19 NOTE — Addendum Note (Signed)
Addended by: Alysia Penna A on: 03/19/2017 01:06 PM   Modules accepted: Orders

## 2017-06-04 ENCOUNTER — Other Ambulatory Visit: Payer: Self-pay | Admitting: Family Medicine

## 2017-06-04 ENCOUNTER — Encounter: Payer: Self-pay | Admitting: Family Medicine

## 2017-06-04 ENCOUNTER — Ambulatory Visit (INDEPENDENT_AMBULATORY_CARE_PROVIDER_SITE_OTHER): Payer: Medicare Other | Admitting: Family Medicine

## 2017-06-04 VITALS — BP 139/94 | HR 66 | Temp 99.4°F | Ht 68.5 in | Wt 246.0 lb

## 2017-06-04 DIAGNOSIS — R5081 Fever presenting with conditions classified elsewhere: Secondary | ICD-10-CM | POA: Diagnosis not present

## 2017-06-04 LAB — CBC WITH DIFFERENTIAL/PLATELET
Basophils Absolute: 0 10*3/uL (ref 0.0–0.1)
Basophils Relative: 0.6 % (ref 0.0–3.0)
EOS PCT: 0.1 % (ref 0.0–5.0)
Eosinophils Absolute: 0 10*3/uL (ref 0.0–0.7)
HEMATOCRIT: 53.1 % — AB (ref 39.0–52.0)
HEMOGLOBIN: 17.7 g/dL — AB (ref 13.0–17.0)
LYMPHS ABS: 0.5 10*3/uL — AB (ref 0.7–4.0)
LYMPHS PCT: 13.6 % (ref 12.0–46.0)
MCHC: 33.3 g/dL (ref 30.0–36.0)
MCV: 80.2 fl (ref 78.0–100.0)
Monocytes Absolute: 0.3 10*3/uL (ref 0.1–1.0)
Monocytes Relative: 7.4 % (ref 3.0–12.0)
NEUTROS PCT: 78.3 % — AB (ref 43.0–77.0)
Neutro Abs: 2.7 10*3/uL (ref 1.4–7.7)
Platelets: 108 10*3/uL — ABNORMAL LOW (ref 150.0–400.0)
RBC: 6.62 Mil/uL — AB (ref 4.22–5.81)
RDW: 16.1 % — ABNORMAL HIGH (ref 11.5–15.5)
WBC: 3.5 10*3/uL — ABNORMAL LOW (ref 4.0–10.5)

## 2017-06-04 LAB — BASIC METABOLIC PANEL
BUN: 20 mg/dL (ref 6–23)
CALCIUM: 9.3 mg/dL (ref 8.4–10.5)
CO2: 30 meq/L (ref 19–32)
CREATININE: 1.58 mg/dL — AB (ref 0.40–1.50)
Chloride: 92 mEq/L — ABNORMAL LOW (ref 96–112)
GFR: 46.67 mL/min — ABNORMAL LOW (ref >60.00)
GLUCOSE: 93 mg/dL (ref 70–99)
Potassium: 3.4 mEq/L — ABNORMAL LOW (ref 3.5–5.1)
Sodium: 131 mEq/L — ABNORMAL LOW (ref 135–145)

## 2017-06-04 LAB — HEPATIC FUNCTION PANEL
ALT: 50 U/L (ref 0–53)
AST: 47 U/L — ABNORMAL HIGH (ref 0–37)
Albumin: 4.3 g/dL (ref 3.5–5.2)
Alkaline Phosphatase: 77 U/L (ref 39–117)
Bilirubin, Direct: 0.3 mg/dL (ref 0.0–0.3)
TOTAL PROTEIN: 7 g/dL (ref 6.0–8.3)
Total Bilirubin: 1.2 mg/dL (ref 0.2–1.2)

## 2017-06-04 MED ORDER — DOXYCYCLINE HYCLATE 100 MG PO CAPS: 100.0000 mg | ORAL_CAPSULE | Freq: Two times a day (BID) | ORAL | 0 refills | Status: AC

## 2017-06-04 NOTE — Patient Instructions (Signed)
WE NOW OFFER   [facility] FAST TRACK!!!  SAME DAY Appointments for ACUTE CARE  Such as: Sprains, Injuries, cuts, abrasions, rashes, muscle pain, joint pain, back pain Colds, flu, sore throats, headache, allergies, cough, fever  Ear pain, sinus and eye infections Abdominal pain, nausea, vomiting, diarrhea, upset stomach Animal/insect bites  3 Easy Ways to Schedule: Walk-In Scheduling Call in scheduling Mychart Sign-up: [email or web address]         

## 2017-06-04 NOTE — Progress Notes (Signed)
   Subjective:    Patient ID: Joseph Duncan, male    DOB: [birth year], 66 y.o.   MRN: [id number]  HPI Here for 3 days of fevers, sweats and chills, deceased appetite, mild nausea without vomiting, and a mild headache. No ST or cough, no UTI symptoms. BMs are normal. No recent travel. No known tick bites. No rashes.    Review of Systems  Constitutional: Positive for appetite change, chills, diaphoresis, fatigue and fever.  HENT: Negative.   Eyes: Negative.   Respiratory: Negative.   Cardiovascular: Negative.   Gastrointestinal: Positive for nausea. Negative for abdominal distention, abdominal pain, anal bleeding, blood in stool, constipation, diarrhea, rectal pain and vomiting.  Genitourinary: Negative.   Musculoskeletal: Negative.  Negative for neck pain and neck stiffness.  Skin: Negative for rash.  Neurological: Positive for headaches.       Objective:   Physical Exam  Constitutional: He is oriented to person, place, and time. He appears well-developed and well-nourished. No distress.  HENT:  Right Ear: External ear normal.  Left Ear: External ear normal.  Nose: Nose normal.  Mouth/Throat: Oropharynx is clear and moist.  Eyes: Conjunctivae are normal.  Neck: No thyromegaly present.  Cardiovascular: Normal rate, regular rhythm, normal heart sounds and intact distal pulses.   No murmur heard. Pulmonary/Chest: Effort normal and breath sounds normal. No respiratory distress. He has no wheezes. He has no rales.  Abdominal: Soft. Bowel sounds are normal. He exhibits no distension and no mass. There is no tenderness. There is no rebound and no guarding.  Musculoskeletal: He exhibits no edema.  Lymphadenopathy:    He has no cervical adenopathy.  Neurological: He is alert and oriented to person, place, and time.  Skin: No rash noted.  There is small excoriated red spot on the right lower lateral leg which could represent an insect bite           Assessment & Plan:  Fevers  and headache of uncertain etiology. We will get labs including CBC, UA, EBV titers and Lyme titers. Cover for tick borne illness with Doxycycline 100 mg for 10 days. He will be leaving town to vacation in [place], [place] tomorrow so we will remain in touch by phone. Drink plenty of fluids. Use Tylenol prn.  Alysia Penna, MD

## 2017-06-05 LAB — EPSTEIN-BARR VIRUS VCA, IGG: EBV VCA IgG: >750 U/mL — ABNORMAL HIGH

## 2017-06-05 LAB — LYME AB/WESTERN BLOT REFLEX: B burgdorferi Ab IgG+IgM: <0.9 {index} (ref <0.90)

## 2017-06-05 LAB — EPSTEIN-BARR VIRUS VCA, IGM: EBV VCA IgM: <36 U/mL

## 2017-09-05 ENCOUNTER — Encounter: Payer: Self-pay | Admitting: Family Medicine

## 2017-09-10 DIAGNOSIS — D3132 Benign neoplasm of left choroid: Secondary | ICD-10-CM | POA: Diagnosis not present

## 2017-10-29 IMAGING — MR MR ABDOMEN WO/W CM
11 of 17 series · 29 of 48 positions shown · IV contrast (20ml multihance)
Comparison: No prior imaging.  Clinic note of 02/25/2017 reviewed.

CLINICAL DATA: Right-sided flank/ lower quadrant pain when lying
down for 2 months. Approximately 6 cm mass under this skin, just
inferior to the right ribs.

EXAM:
MRI ABDOMEN WITHOUT AND WITH CONTRAST
TECHNIQUE: Multiplanar multisequence MR imaging of the abdomen was performed
both before and after the administration of intravenous contrast.
CONTRAST:  20mL MULTIHANCE GADOBENATE DIMEGLUMINE 529 MG/ML IV SOLN
Creatinine was obtained on site at [HOSPITAL] at [HOSPITAL].
Results: Creatinine 1.3 mg/dL.

[Series 3: T2 · coronal · 5.0mm · 1.64mm/px · 3 of 40 slices shown (1 of 3)]
[im 1/40]
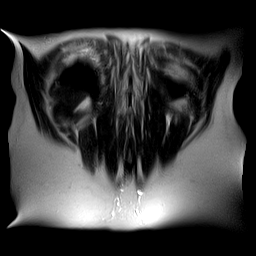
[im 20/40]
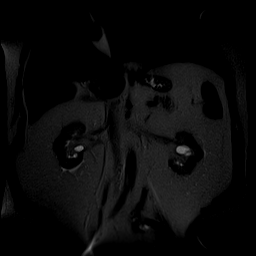
[im 40/40]
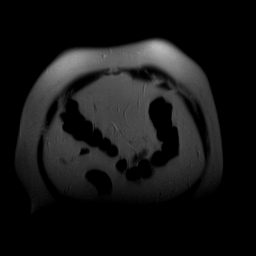

[Series 5: T2 · axial · 5.0mm · 1.64mm/px · z∈[-70,+183]mm · 3 of 40 slices shown (2 of 3)]
[im 1/40]
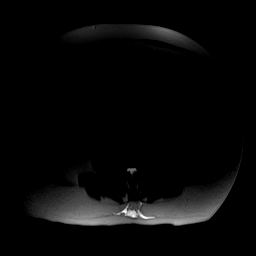
[im 20/40]
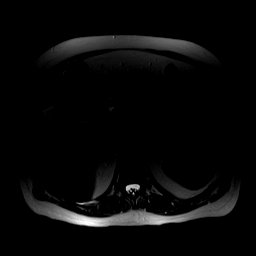
[im 40/40]
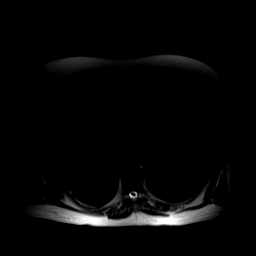

[Series 6: axial tru fisp · axial · 5.0mm · 1.64mm/px · z∈[-70,+183]mm · 3 of 40 slices shown]
[im 1/40]
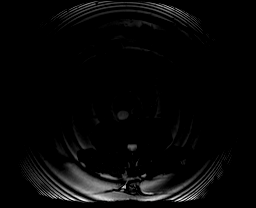
[im 20/40]
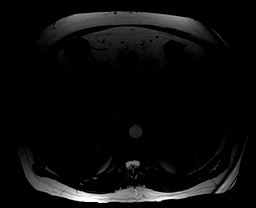
[im 40/40]
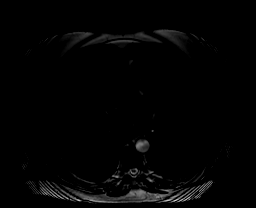

[Series 7: T2 · axial · 6.5mm · 0.82mm/px · 1 of 35 slices shown (3 of 3)]
[im 1/35]
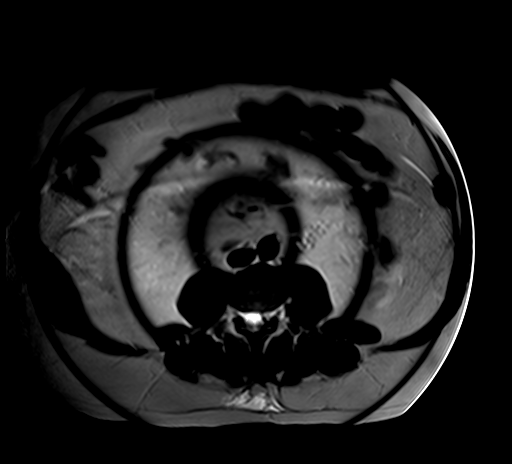

[Series 8: ep2d_diff_b50_500_800_p2 · axial · 6.0mm · 2.19mm/px · z∈[-70,+203]mm · 4 of 108 slices shown]
[im 1/108]
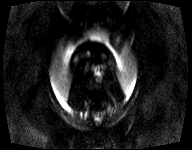
[im 36/108]
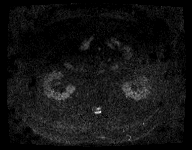
[im 72/108]
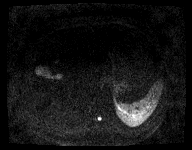
[im 108/108]
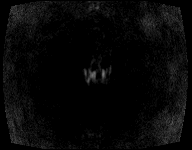

[Series 9: ep2d_diff_b50_500_800_p2_adc · axial · 6.0mm · 2.19mm/px · 1 of 36 slices shown]
[im 1/36]
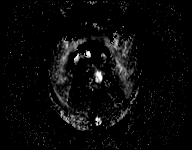

[Series 10: axial in out · axial · 6.0mm · 0.82mm/px · z∈[-78,+191]mm · 3 of 80 slices shown]
[im 1/80]
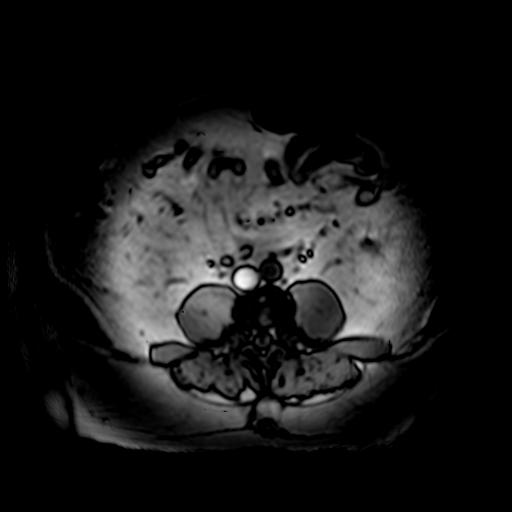
[im 40/80]
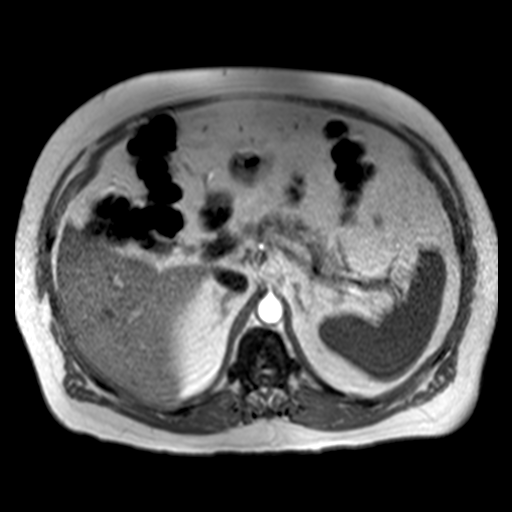
[im 80/80]
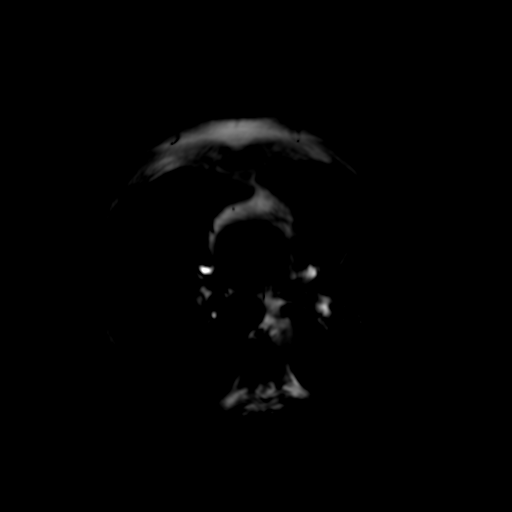

[Series 11: T1 dynamic · axial · non-contrast · 3.0mm · 0.82mm/px · z∈[-82,+179]mm · 3 of 88 slices shown]
[im 1/88]
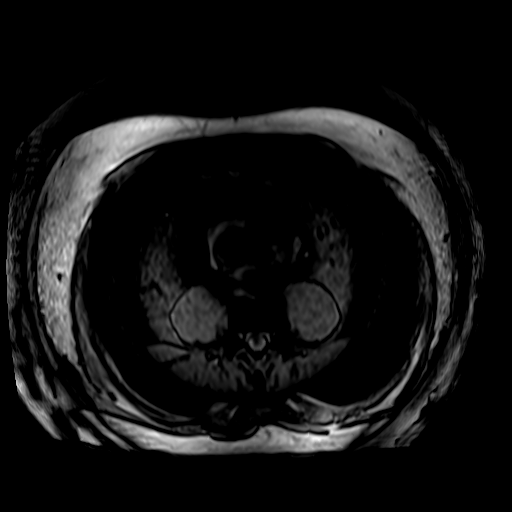
[im 44/88]
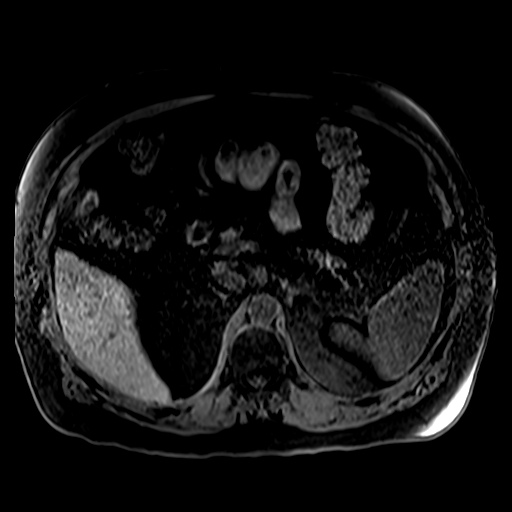
[im 88/88]
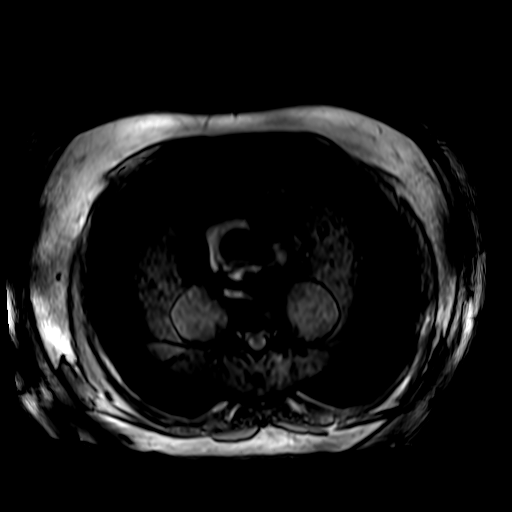

[Series 12: post 25 sec · axial · 3.0mm · 0.82mm/px · z∈[-82,+179]mm · 3 of 88 slices shown]
[im 1/88]
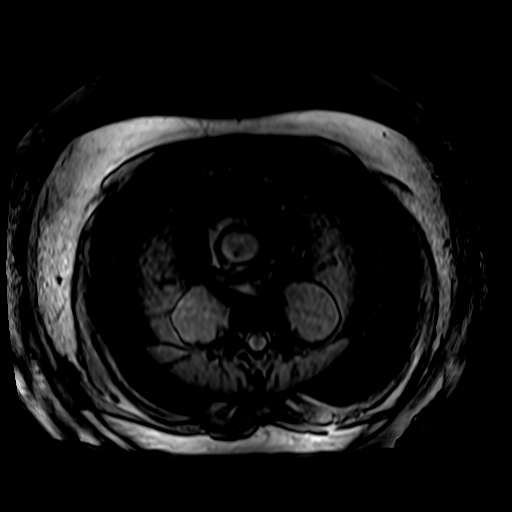
[im 44/88]
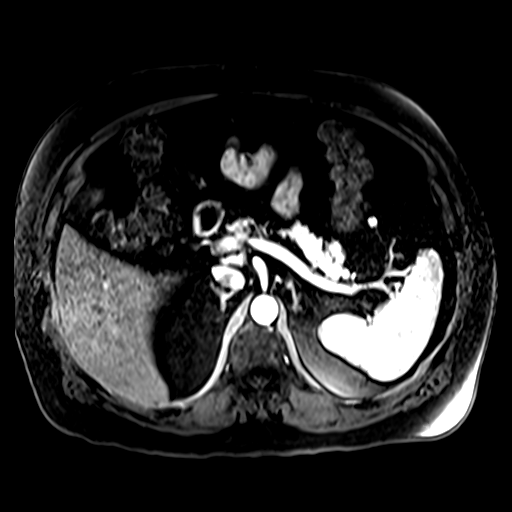
[im 88/88]
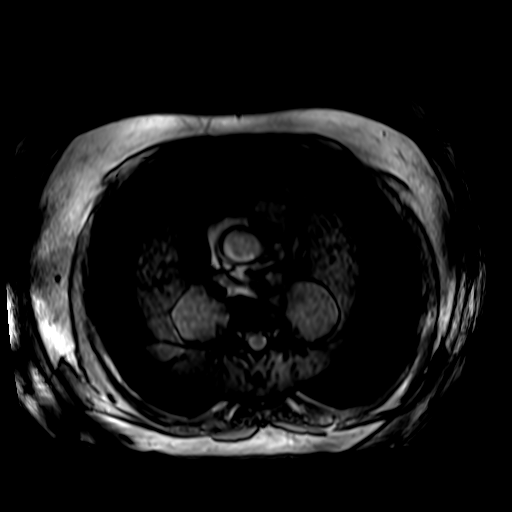

[Series 13: post 25 sec_sub · axial · 3.0mm · 0.82mm/px · z∈[-82,+179]mm · 3 of 88 slices shown]
[im 1/88]
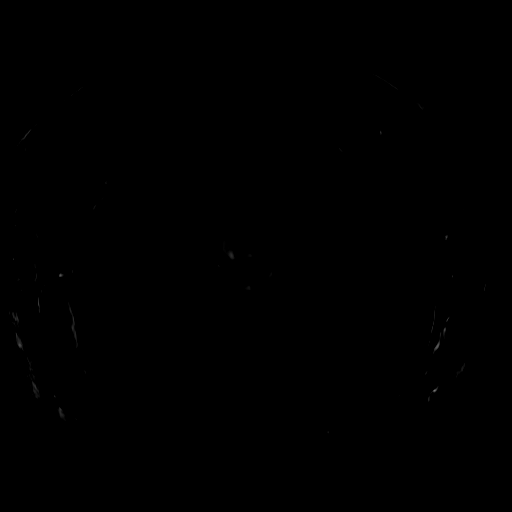
[im 44/88]
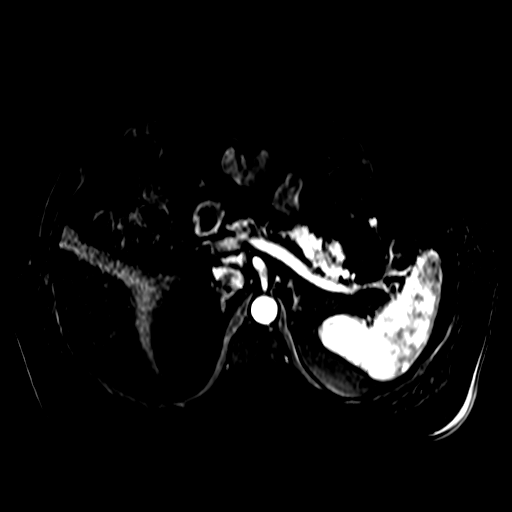
[im 88/88]
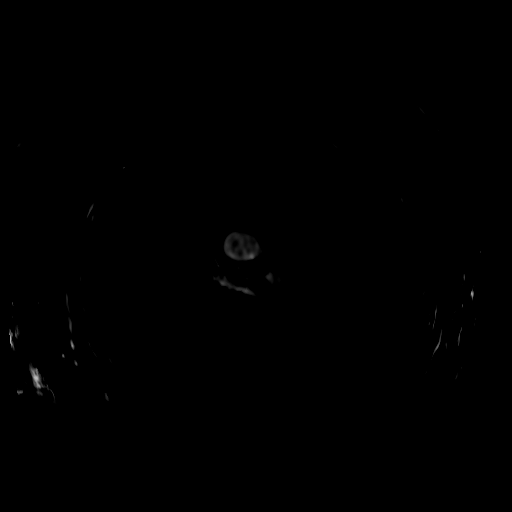

[Series 14: post 45 sec · axial · 3.0mm · 0.82mm/px · z∈[-82,+47]mm · 2 of 88 slices shown]
[im 1/88]
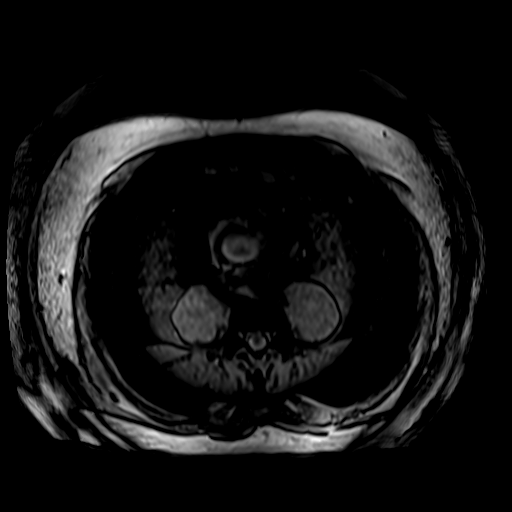
[im 44/88]
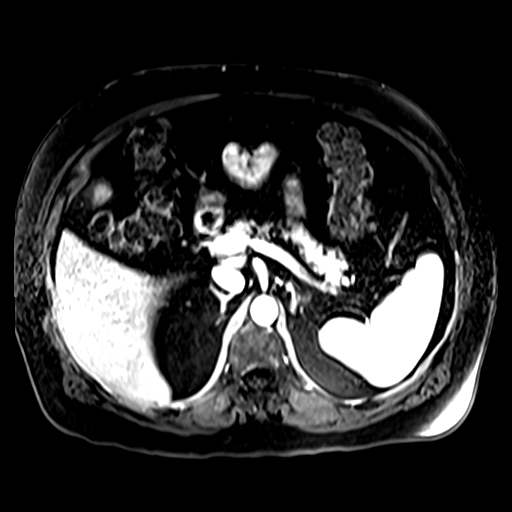

[29 of 48 positions shown; findings below may reference images not displayed]

FINDINGS: Mild limitations secondary to patient body habitus.

Lower chest: Mild cardiomegaly, without pericardial or pleural
effusion.

Hepatobiliary: Heterogeneous hepatic steatosis is moderate. No focal
liver lesion. Normal gallbladder, without biliary ductal dilatation.

Pancreas: Fatty atrophy involving the pancreatic head and uncinate
process. No duct dilatation or mass.

Spleen:  Normal in size, without focal abnormality.

Adrenals/Urinary Tract: Normal adrenal glands. Normal left kidney.
Tiny right renal lesions are likely cysts. No hydronephrosis.

Stomach/Bowel: Normal stomach and abdominal bowel loops.

Vascular/Lymphatic: Normal caliber of the aorta and branch vessels.
No retroperitoneal or retrocrural adenopathy.

Other: No ascites. No correlate for the right flank subcutaneous
mass identified.

Musculoskeletal: No acute osseous abnormality.
IMPRESSION: 1. No MRI correlate for the right flank mass identified.
2.  No acute abdominal process.  No intra-abdominal mass.
3. Hepatic steatosis.

## 2017-11-20 ENCOUNTER — Encounter: Payer: Self-pay | Admitting: Family Medicine

## 2017-11-20 ENCOUNTER — Ambulatory Visit (INDEPENDENT_AMBULATORY_CARE_PROVIDER_SITE_OTHER): Payer: Medicare Other | Admitting: Family Medicine

## 2017-11-20 VITALS — BP 136/80 | HR 60 | Temp 97.9°F | Ht 68.25 in | Wt 242.4 lb

## 2017-11-20 DIAGNOSIS — I10 Essential (primary) hypertension: Secondary | ICD-10-CM | POA: Diagnosis not present

## 2017-11-20 DIAGNOSIS — N138 Other obstructive and reflux uropathy: Secondary | ICD-10-CM

## 2017-11-20 DIAGNOSIS — N401 Enlarged prostate with lower urinary tract symptoms: Secondary | ICD-10-CM

## 2017-11-20 DIAGNOSIS — E785 Hyperlipidemia, unspecified: Secondary | ICD-10-CM | POA: Diagnosis not present

## 2017-11-20 LAB — LIPID PANEL
CHOL/HDL RATIO: 4
CHOLESTEROL: 159 mg/dL (ref 0–200)
HDL: 41.4 mg/dL (ref >39.00)
LDL CALC: 103 mg/dL — AB (ref 0–99)
NONHDL: 117.66
Triglycerides: 75 mg/dL (ref 0.0–149.0)
VLDL: 15 mg/dL (ref 0.0–40.0)

## 2017-11-20 LAB — BASIC METABOLIC PANEL
BUN: 23 mg/dL (ref 6–23)
CALCIUM: 9.5 mg/dL (ref 8.4–10.5)
CO2: 26 mEq/L (ref 19–32)
Chloride: 100 mEq/L (ref 96–112)
Creatinine, Ser: 1.29 mg/dL (ref 0.40–1.50)
GFR: 58.89 mL/min — AB (ref >60.00)
GLUCOSE: 100 mg/dL — AB (ref 70–99)
Potassium: 3.8 mEq/L (ref 3.5–5.1)
Sodium: 137 mEq/L (ref 135–145)

## 2017-11-20 LAB — CBC WITH DIFFERENTIAL/PLATELET
BASOS ABS: 0.1 10*3/uL (ref 0.0–0.1)
Basophils Relative: 0.9 % (ref 0.0–3.0)
EOS PCT: 2.2 % (ref 0.0–5.0)
Eosinophils Absolute: 0.1 10*3/uL (ref 0.0–0.7)
HEMATOCRIT: 51.7 % (ref 39.0–52.0)
Hemoglobin: 16.9 g/dL (ref 13.0–17.0)
LYMPHS ABS: 1.1 10*3/uL (ref 0.7–4.0)
LYMPHS PCT: 19.9 % (ref 12.0–46.0)
MCHC: 32.7 g/dL (ref 30.0–36.0)
MCV: 81.8 fl (ref 78.0–100.0)
MONOS PCT: 6.9 % (ref 3.0–12.0)
Monocytes Absolute: 0.4 10*3/uL (ref 0.1–1.0)
NEUTROS ABS: 4.1 10*3/uL (ref 1.4–7.7)
Neutrophils Relative %: 70.1 % (ref 43.0–77.0)
PLATELETS: 135 10*3/uL — AB (ref 150.0–400.0)
RBC: 6.32 Mil/uL — ABNORMAL HIGH (ref 4.22–5.81)
RDW: 15.9 % — ABNORMAL HIGH (ref 11.5–15.5)
WBC: 5.8 10*3/uL (ref 4.0–10.5)

## 2017-11-20 LAB — POC URINALSYSI DIPSTICK (AUTOMATED)
BILIRUBIN UA: NEGATIVE
Blood, UA: NEGATIVE
GLUCOSE UA: NEGATIVE
LEUKOCYTES UA: NEGATIVE
NITRITE UA: NEGATIVE
PH UA: 6 (ref 5.0–8.0)
Protein, UA: NEGATIVE
Spec Grav, UA: >=1.03 — AB (ref 1.010–1.025)
Urobilinogen, UA: 0.2 E.U./dL

## 2017-11-20 LAB — HEPATIC FUNCTION PANEL
ALK PHOS: 65 U/L (ref 39–117)
ALT: 46 U/L (ref 0–53)
AST: 38 U/L — AB (ref 0–37)
Albumin: 4.5 g/dL (ref 3.5–5.2)
BILIRUBIN DIRECT: 0.3 mg/dL (ref 0.0–0.3)
Total Bilirubin: 1.2 mg/dL (ref 0.2–1.2)
Total Protein: 7.2 g/dL (ref 6.0–8.3)

## 2017-11-20 LAB — TSH: TSH: 2.89 u[IU]/mL (ref 0.35–4.50)

## 2017-11-20 LAB — PSA: PSA: 0.93 ng/mL (ref 0.10–4.00)

## 2017-11-20 MED ORDER — CHLORTHALIDONE 25 MG PO TABS: 25.0000 mg | ORAL_TABLET | Freq: Every day | ORAL | 3 refills | Status: DC

## 2017-11-20 NOTE — Progress Notes (Signed)
   Subjective:    Patient ID: Joseph Duncan, male    DOB: 1951/03/01, 66 y.o.   MRN: [id number]  HPI Here to follow up issues. He feels well. He has been worried about his health because all 3 of his siblings have had heart attacks in their 57s or 40s. His 59 yr old brother in [place] recently died of a heart attack. [place] has been trying a vegan diet and he plans to exercise as well. His BP is stable.    Review of Systems  Constitutional: Negative.   HENT: Negative.   Eyes: Negative.   Respiratory: Negative.   Cardiovascular: Negative.   Gastrointestinal: Negative.   Genitourinary: Negative.   Musculoskeletal: Negative.   Skin: Negative.   Neurological: Negative.   Psychiatric/Behavioral: Negative.        Objective:   Physical Exam  Constitutional: He is oriented to person, place, and time. He appears well-developed and well-nourished. No distress.  HENT:  Head: Normocephalic and atraumatic.  Right Ear: External ear normal.  Left Ear: External ear normal.  Nose: Nose normal.  Mouth/Throat: Oropharynx is clear and moist. No oropharyngeal exudate.  Eyes: Conjunctivae and EOM are normal. Pupils are equal, round, and reactive to light. Right eye exhibits no discharge. Left eye exhibits no discharge. No scleral icterus.  Neck: Neck supple. No JVD present. No tracheal deviation present. No thyromegaly present.  Cardiovascular: Normal rate, regular rhythm, normal heart sounds and intact distal pulses. Exam reveals no gallop and no friction rub.  No murmur heard. Pulmonary/Chest: Effort normal and breath sounds normal. No respiratory distress. He has no wheezes. He has no rales. He exhibits no tenderness.  Abdominal: Soft. Bowel sounds are normal. He exhibits no distension and no mass. There is no tenderness. There is no rebound and no guarding.  Genitourinary: Rectum normal, prostate normal and penis normal. Rectal exam shows guaiac negative stool. No penile tenderness.    Musculoskeletal: Normal range of motion. He exhibits no edema or tenderness.  Lymphadenopathy:    He has no cervical adenopathy.  Neurological: He is alert and oriented to person, place, and time. He has normal reflexes. No cranial nerve deficit. He exhibits normal muscle tone. Coordination normal.  Skin: Skin is warm and dry. No rash noted. He is not diaphoretic. No erythema. No pallor.  Psychiatric: He has a normal mood and affect. His behavior is normal. Judgment and thought content normal.          Assessment & Plan:  His HTN is stable. His allergies are stable. He is overweight and we discussed diet and exercise today. Get fasting labs.  Alysia Penna, MD

## 2017-11-21 ENCOUNTER — Telehealth: Payer: Self-pay | Admitting: Family Medicine

## 2017-11-21 NOTE — Telephone Encounter (Signed)
Pt called and notified that lab results were normal per documentation of Dr. Sarajane Jews. Pt verbalized understanding.

## 2018-05-26 ENCOUNTER — Encounter: Payer: Self-pay | Admitting: Family Medicine

## 2018-05-26 ENCOUNTER — Ambulatory Visit (INDEPENDENT_AMBULATORY_CARE_PROVIDER_SITE_OTHER): Payer: Medicare Other | Admitting: Family Medicine

## 2018-05-26 VITALS — BP 118/80 | HR 53 | Temp 98.1°F | Ht 68.25 in | Wt 243.8 lb

## 2018-05-26 DIAGNOSIS — M67471 Ganglion, right ankle and foot: Secondary | ICD-10-CM

## 2018-05-26 NOTE — Progress Notes (Signed)
   Subjective:    Patient ID: Joseph Duncan, male    DOB: 10/22/1951, 67 y.o.   MRN: [id number]  HPI Here for a recurrent cyst on the right 3rd toe. This appeared 8 months ago. He has opened it twice and it drains a thick jelly-like fluid. However it always returns. It is not painful.    Review of Systems  Constitutional: Negative.   Respiratory: Negative.   Cardiovascular: Negative.        Objective:   Physical Exam  Constitutional: He appears well-developed and well-nourished.  Cardiovascular: Normal rate, regular rhythm, normal heart sounds and intact distal pulses.  Pulmonary/Chest: Effort normal and breath sounds normal.  Skin:  The right 3rd toe has an area of swelling at the nail base, no erythema or tenderness          Assessment & Plan:  Mucinous cyst of the toe. Refer to Podiatry for a definitive removal of the cyst.  Alysia Penna, MD

## 2018-06-09 ENCOUNTER — Encounter: Payer: Self-pay | Admitting: Podiatry

## 2018-06-09 ENCOUNTER — Other Ambulatory Visit: Payer: Self-pay

## 2018-06-09 ENCOUNTER — Other Ambulatory Visit: Payer: Self-pay | Admitting: Podiatry

## 2018-06-09 ENCOUNTER — Ambulatory Visit (INDEPENDENT_AMBULATORY_CARE_PROVIDER_SITE_OTHER): Payer: Medicare Other

## 2018-06-09 ENCOUNTER — Ambulatory Visit: Payer: Medicare Other | Admitting: Podiatry

## 2018-06-09 VITALS — BP 166/91 | HR 50

## 2018-06-09 DIAGNOSIS — M79671 Pain in right foot: Secondary | ICD-10-CM

## 2018-06-09 DIAGNOSIS — L723 Sebaceous cyst: Secondary | ICD-10-CM | POA: Diagnosis not present

## 2018-06-09 NOTE — Progress Notes (Signed)
Subjective:   Patient ID: Joseph Duncan, male   DOB: 67 y.o.   MRN: [id number]   HPI Patient presents stating the right third toe there is been a cyst that is probably twice on this toe and yellow gelatinlike fluid is come out with no pain and I need to get it checked and see if anything else can be done.  Patient does not smoke and likes to be active   Review of Systems  All other systems reviewed and are negative.       Objective:  Physical Exam  Constitutional: He appears well-developed and well-nourished.  Cardiovascular: Intact distal pulses.  Pulmonary/Chest: Effort normal.  Musculoskeletal: Normal range of motion.  Neurological: He is alert.  Skin: Skin is warm.  Nursing note and vitals reviewed.   Neurovascular status intact muscle strength is adequate range of motion within normal limits with patient noted to have a crusted cyst on the proximal portion of the nail fold right third toe localized with no proximal edema erythema drainage or joint involvement.  It is not tender when palpated and does appear to have a darker crusted material on top     Assessment:  Probability for mucoid cyst digit 3 right at the proximal nail fold     Plan:  H&P condition reviewed and at this point I utilizing sterile his mentation I debrided the tissue and found there to be a small amount of gelatinous fluid which I cleaned out.  I did not note any extension from this in a negative way and I did apply a small step to stick to it to try to kill any tissue which may be present and applied sterile dressing.  Patient was given instructions on soaks and will be seen back if symptoms were to reoccur persist  X-ray indicates slight changes at the interphalangeal joint but nothing of significance

## 2018-08-26 DIAGNOSIS — D3132 Benign neoplasm of left choroid: Secondary | ICD-10-CM | POA: Diagnosis not present

## 2018-11-15 ENCOUNTER — Other Ambulatory Visit: Payer: Self-pay | Admitting: Family Medicine

## 2018-11-25 ENCOUNTER — Encounter: Payer: Self-pay | Admitting: Family Medicine

## 2018-11-25 ENCOUNTER — Ambulatory Visit (INDEPENDENT_AMBULATORY_CARE_PROVIDER_SITE_OTHER): Payer: Medicare Other | Admitting: Family Medicine

## 2018-11-25 VITALS — BP 120/70 | HR 56 | Temp 98.2°F | Wt 244.4 lb

## 2018-11-25 DIAGNOSIS — J3089 Other allergic rhinitis: Secondary | ICD-10-CM | POA: Diagnosis not present

## 2018-11-25 DIAGNOSIS — I10 Essential (primary) hypertension: Secondary | ICD-10-CM

## 2018-11-25 DIAGNOSIS — N401 Enlarged prostate with lower urinary tract symptoms: Secondary | ICD-10-CM | POA: Diagnosis not present

## 2018-11-25 DIAGNOSIS — L409 Psoriasis, unspecified: Secondary | ICD-10-CM

## 2018-11-25 DIAGNOSIS — N138 Other obstructive and reflux uropathy: Secondary | ICD-10-CM

## 2018-11-25 DIAGNOSIS — E785 Hyperlipidemia, unspecified: Secondary | ICD-10-CM | POA: Diagnosis not present

## 2018-11-25 LAB — CBC WITH DIFFERENTIAL/PLATELET
Basophils Absolute: 0.1 10*3/uL (ref 0.0–0.1)
Basophils Relative: 0.9 % (ref 0.0–3.0)
Eosinophils Absolute: 0.1 10*3/uL (ref 0.0–0.7)
Eosinophils Relative: 2.2 % (ref 0.0–5.0)
HCT: 52 % (ref 39.0–52.0)
Hemoglobin: 17.1 g/dL — ABNORMAL HIGH (ref 13.0–17.0)
Lymphocytes Relative: 22.9 % (ref 12.0–46.0)
Lymphs Abs: 1.4 10*3/uL (ref 0.7–4.0)
MCHC: 33 g/dL (ref 30.0–36.0)
MCV: 82.3 fl (ref 78.0–100.0)
Monocytes Absolute: 0.4 10*3/uL (ref 0.1–1.0)
Monocytes Relative: 6.5 % (ref 3.0–12.0)
NEUTROS PCT: 67.5 % (ref 43.0–77.0)
Neutro Abs: 4.3 10*3/uL (ref 1.4–7.7)
Platelets: 141 10*3/uL — ABNORMAL LOW (ref 150.0–400.0)
RBC: 6.32 Mil/uL — AB (ref 4.22–5.81)
RDW: 17.7 % — ABNORMAL HIGH (ref 11.5–15.5)
WBC: 6.3 10*3/uL (ref 4.0–10.5)

## 2018-11-25 LAB — LIPID PANEL
CHOLESTEROL: 183 mg/dL (ref 0–200)
HDL: 52.1 mg/dL (ref >39.00)
LDL Cholesterol: 115 mg/dL — ABNORMAL HIGH (ref 0–99)
NonHDL: 130.98
TRIGLYCERIDES: 78 mg/dL (ref 0.0–149.0)
Total CHOL/HDL Ratio: 4
VLDL: 15.6 mg/dL (ref 0.0–40.0)

## 2018-11-25 LAB — BASIC METABOLIC PANEL
BUN: 20 mg/dL (ref 6–23)
CHLORIDE: 100 meq/L (ref 96–112)
CO2: 34 mEq/L — ABNORMAL HIGH (ref 19–32)
Calcium: 10.2 mg/dL (ref 8.4–10.5)
Creatinine, Ser: 1.32 mg/dL (ref 0.40–1.50)
GFR: 57.17 mL/min — ABNORMAL LOW (ref >60.00)
GLUCOSE: 101 mg/dL — AB (ref 70–99)
POTASSIUM: 4.4 meq/L (ref 3.5–5.1)
Sodium: 141 mEq/L (ref 135–145)

## 2018-11-25 LAB — HEPATIC FUNCTION PANEL
ALK PHOS: 73 U/L (ref 39–117)
ALT: 33 U/L (ref 0–53)
AST: 28 U/L (ref 0–37)
Albumin: 4.4 g/dL (ref 3.5–5.2)
Bilirubin, Direct: 0.2 mg/dL (ref 0.0–0.3)
TOTAL PROTEIN: 7.3 g/dL (ref 6.0–8.3)
Total Bilirubin: 1.1 mg/dL (ref 0.2–1.2)

## 2018-11-25 LAB — POC URINALSYSI DIPSTICK (AUTOMATED)
Bilirubin, UA: NEGATIVE
Blood, UA: NEGATIVE
Glucose, UA: NEGATIVE
Ketones, UA: NEGATIVE
Leukocytes, UA: NEGATIVE
Nitrite, UA: NEGATIVE
Protein, UA: NEGATIVE
Urobilinogen, UA: 0.2 E.U./dL
pH, UA: 6 (ref 5.0–8.0)

## 2018-11-25 LAB — PSA: PSA: 1.25 ng/mL (ref 0.10–4.00)

## 2018-11-25 LAB — TSH: TSH: 2.73 u[IU]/mL (ref 0.35–4.50)

## 2018-11-25 MED ORDER — CHLORTHALIDONE 25 MG PO TABS
25.0000 mg | ORAL_TABLET | Freq: Every day | ORAL | 3 refills | Status: DC
Start: 2018-11-25 — End: 2019-12-04

## 2018-11-25 MED ORDER — CLOBETASOL PROPIONATE 0.05 % EX SOLN: 1.0000 "application " | Freq: Two times a day (BID) | CUTANEOUS | 5 refills | Status: DC

## 2018-11-25 MED ORDER — FLUTICASONE PROPIONATE 50 MCG/ACT NA SUSP: NASAL | 3 refills | Status: AC

## 2018-11-25 NOTE — Progress Notes (Signed)
   Subjective:    Patient ID: Joseph Duncan, male    DOB: 1951-03-14, 67 y.o.   MRN: [id number]  HPI Here to follow up on issues. He feels well in general but he asks about an itchy rash on the top of his head that started about 8 months ago. The scalp flakes a lot. He has tried a number of OTC shampoos, most recently Tea Tree, but these have not helped. No other areas of skin on his body are involved. His BP is stable. His allergies are stable.   Review of Systems  Constitutional: Negative.   HENT: Negative.   Eyes: Negative.   Respiratory: Negative.   Cardiovascular: Negative.   Gastrointestinal: Negative.   Genitourinary: Negative.   Musculoskeletal: Negative.   Skin: Positive for rash.  Neurological: Negative.   Psychiatric/Behavioral: Negative.        Objective:   Physical Exam  Constitutional: He is oriented to person, place, and time. He appears well-developed and well-nourished. No distress.  HENT:  Head: Normocephalic and atraumatic.  Right Ear: External ear normal.  Left Ear: External ear normal.  Nose: Nose normal.  Mouth/Throat: Oropharynx is clear and moist. No oropharyngeal exudate.  Eyes: Pupils are equal, round, and reactive to light. Conjunctivae and EOM are normal. Right eye exhibits no discharge. Left eye exhibits no discharge. No scleral icterus.  Neck: Neck supple. No JVD present. No tracheal deviation present. No thyromegaly present.  Cardiovascular: Normal rate, regular rhythm, normal heart sounds and intact distal pulses. Exam reveals no gallop and no friction rub.  No murmur heard. Pulmonary/Chest: Effort normal and breath sounds normal. No respiratory distress. He has no wheezes. He has no rales. He exhibits no tenderness.  Abdominal: Soft. Bowel sounds are normal. He exhibits no distension and no mass. There is no tenderness. There is no rebound and no guarding.  Genitourinary: Rectum normal, prostate normal and penis normal. Rectal exam shows guaiac  negative stool. No penile tenderness.  Musculoskeletal: Normal range of motion. He exhibits no edema or tenderness.  Lymphadenopathy:    He has no cervical adenopathy.  Neurological: He is alert and oriented to person, place, and time. He has normal reflexes. He displays normal reflexes. No cranial nerve deficit. He exhibits normal muscle tone. Coordination normal.  Skin: Skin is warm and dry. He is not diaphoretic. No erythema. No pallor.  The vertex of the scalp has areas of macular erythema with a lot of flaking   Psychiatric: He has a normal mood and affect. His behavior is normal. Judgment and thought content normal.          Assessment & Plan:  His HTN and allergies are stable. He has psoriasis on the scalp so he will try applying Clobetasol solution BID. Get fasting labs to check lipids, etc.  Alysia Penna, MD

## 2018-11-28 ENCOUNTER — Encounter: Payer: Self-pay | Admitting: *Deleted

## 2019-08-31 DIAGNOSIS — D3132 Benign neoplasm of left choroid: Secondary | ICD-10-CM | POA: Diagnosis not present

## 2019-11-26 ENCOUNTER — Other Ambulatory Visit: Payer: Self-pay

## 2019-11-27 ENCOUNTER — Encounter: Payer: Self-pay | Admitting: Family Medicine

## 2019-11-27 ENCOUNTER — Ambulatory Visit (INDEPENDENT_AMBULATORY_CARE_PROVIDER_SITE_OTHER): Payer: Medicare Other | Admitting: Family Medicine

## 2019-11-27 VITALS — BP 140/82 | HR 48 | Temp 98.2°F | Ht 69.0 in | Wt 249.2 lb

## 2019-11-27 DIAGNOSIS — Z0000 Encounter for general adult medical examination without abnormal findings: Secondary | ICD-10-CM | POA: Diagnosis not present

## 2019-11-27 DIAGNOSIS — E039 Hypothyroidism, unspecified: Secondary | ICD-10-CM | POA: Diagnosis not present

## 2019-11-27 LAB — HEPATIC FUNCTION PANEL
ALT: 29 U/L (ref 0–53)
AST: 23 U/L (ref 0–37)
Albumin: 4.4 g/dL (ref 3.5–5.2)
Alkaline Phosphatase: 81 U/L (ref 39–117)
Bilirubin, Direct: 0.2 mg/dL (ref 0.0–0.3)
Total Bilirubin: 1.3 mg/dL — ABNORMAL HIGH (ref 0.2–1.2)
Total Protein: 7.4 g/dL (ref 6.0–8.3)

## 2019-11-27 LAB — CBC WITH DIFFERENTIAL/PLATELET
Basophils Absolute: 0.1 10*3/uL (ref 0.0–0.1)
Basophils Relative: 0.8 % (ref 0.0–3.0)
Eosinophils Absolute: 0.2 10*3/uL (ref 0.0–0.7)
Eosinophils Relative: 2.2 % (ref 0.0–5.0)
HCT: 50.7 % (ref 39.0–52.0)
Hemoglobin: 16.6 g/dL (ref 13.0–17.0)
Lymphocytes Relative: 21.5 % (ref 12.0–46.0)
Lymphs Abs: 1.6 10*3/uL (ref 0.7–4.0)
MCHC: 32.6 g/dL (ref 30.0–36.0)
MCV: 83.9 fl (ref 78.0–100.0)
Monocytes Absolute: 0.5 10*3/uL (ref 0.1–1.0)
Monocytes Relative: 6.9 % (ref 3.0–12.0)
Neutro Abs: 5.1 10*3/uL (ref 1.4–7.7)
Neutrophils Relative %: 68.6 % (ref 43.0–77.0)
Platelets: 148 10*3/uL — ABNORMAL LOW (ref 150.0–400.0)
RBC: 6.04 Mil/uL — ABNORMAL HIGH (ref 4.22–5.81)
RDW: 17.2 % — ABNORMAL HIGH (ref 11.5–15.5)
WBC: 7.4 10*3/uL (ref 4.0–10.5)

## 2019-11-27 LAB — BASIC METABOLIC PANEL
BUN: 17 mg/dL (ref 6–23)
CO2: 30 mEq/L (ref 19–32)
Calcium: 9.6 mg/dL (ref 8.4–10.5)
Chloride: 97 mEq/L (ref 96–112)
Creatinine, Ser: 1.18 mg/dL (ref 0.40–1.50)
GFR: 61.04 mL/min (ref >60.00)
Glucose, Bld: 83 mg/dL (ref 70–99)
Potassium: 3.6 mEq/L (ref 3.5–5.1)
Sodium: 136 mEq/L (ref 135–145)

## 2019-11-27 LAB — LIPID PANEL
Cholesterol: 186 mg/dL (ref 0–200)
HDL: 46.8 mg/dL (ref >39.00)
LDL Cholesterol: 123 mg/dL — ABNORMAL HIGH (ref 0–99)
NonHDL: 139.36
Total CHOL/HDL Ratio: 4
Triglycerides: 80 mg/dL (ref 0.0–149.0)
VLDL: 16 mg/dL (ref 0.0–40.0)

## 2019-11-27 LAB — PSA: PSA: 1.17 ng/mL (ref 0.10–4.00)

## 2019-11-27 LAB — TSH: TSH: 4.95 u[IU]/mL — ABNORMAL HIGH (ref 0.35–4.50)

## 2019-11-27 NOTE — Progress Notes (Signed)
   Subjective:    Patient ID: Joseph Duncan, male    DOB: 09-Jul-1951, 68 y.o.   MRN: [id number]  HPI Here for a well exam. He feels fine. His allergies have been well controlled and his scalp psoriasis is stable.    Review of Systems  Constitutional: Negative.   HENT: Negative.   Eyes: Negative.   Respiratory: Negative.   Cardiovascular: Negative.   Gastrointestinal: Negative.   Genitourinary: Negative.   Musculoskeletal: Negative.   Skin: Negative.   Neurological: Negative.   Psychiatric/Behavioral: Negative.        Objective:   Physical Exam Constitutional:      General: He is not in acute distress.    Appearance: He is well-developed. He is obese. He is not diaphoretic.  HENT:     Head: Normocephalic and atraumatic.     Right Ear: External ear normal.     Left Ear: External ear normal.     Nose: Nose normal.     Mouth/Throat:     Pharynx: No oropharyngeal exudate.  Eyes:     General: No scleral icterus.       Right eye: No discharge.        Left eye: No discharge.     Conjunctiva/sclera: Conjunctivae normal.     Pupils: Pupils are equal, round, and reactive to light.  Neck:     Thyroid: No thyromegaly.     Vascular: No JVD.     Trachea: No tracheal deviation.  Cardiovascular:     Rate and Rhythm: Normal rate and regular rhythm.     Heart sounds: Normal heart sounds. No murmur. No friction rub. No gallop.   Pulmonary:     Effort: Pulmonary effort is normal. No respiratory distress.     Breath sounds: Normal breath sounds. No wheezing or rales.  Chest:     Chest wall: No tenderness.  Abdominal:     General: Bowel sounds are normal. There is no distension.     Palpations: Abdomen is soft. There is no mass.     Tenderness: There is no abdominal tenderness. There is no guarding or rebound.  Genitourinary:    Penis: Normal. No tenderness.      Testes: Normal.     Prostate: Normal.     Rectum: Normal. Guaiac result negative.  Musculoskeletal:      General: No tenderness. Normal range of motion.     Cervical back: Neck supple.  Lymphadenopathy:     Cervical: No cervical adenopathy.  Skin:    General: Skin is warm and dry.     Coloration: Skin is not pale.     Findings: No erythema or rash.  Neurological:     Mental Status: He is alert and oriented to person, place, and time.     Cranial Nerves: No cranial nerve deficit.     Motor: No abnormal muscle tone.     Coordination: Coordination normal.     Deep Tendon Reflexes: Reflexes are normal and symmetric. Reflexes normal.  Psychiatric:        Behavior: Behavior normal.        Thought Content: Thought content normal.        Judgment: Judgment normal.           Assessment & Plan:  Well exam. We discussed diet and exercise. Get fasting labs.  Alysia Penna, MD

## 2019-12-01 NOTE — Addendum Note (Signed)
Addended by: Gwenyth Ober R on: 12/01/2019 09:09 AM   Modules accepted: Orders

## 2019-12-03 ENCOUNTER — Other Ambulatory Visit: Payer: Self-pay

## 2019-12-04 ENCOUNTER — Other Ambulatory Visit: Payer: Self-pay | Admitting: Family Medicine

## 2019-12-04 ENCOUNTER — Other Ambulatory Visit (INDEPENDENT_AMBULATORY_CARE_PROVIDER_SITE_OTHER): Payer: Medicare Other

## 2019-12-04 DIAGNOSIS — E039 Hypothyroidism, unspecified: Secondary | ICD-10-CM | POA: Diagnosis not present

## 2019-12-04 LAB — T3, FREE: T3, Free: 3.2 pg/mL (ref 2.3–4.2)

## 2019-12-04 LAB — TSH: TSH: 4.14 u[IU]/mL (ref 0.35–4.50)

## 2019-12-04 LAB — T4, FREE: Free T4: 0.64 ng/dL (ref 0.60–1.60)

## 2020-02-18 ENCOUNTER — Ambulatory Visit: Payer: Medicare Other | Attending: Internal Medicine

## 2020-02-18 DIAGNOSIS — Z23 Encounter for immunization: Secondary | ICD-10-CM

## 2020-02-18 NOTE — Progress Notes (Signed)
2  Covid-19 Vaccination Clinic  Name:  TAVIONE REIGNER    MRN: [id number] DOB: 1951-10-24  02/18/2020  Mr. Forrister was observed post Covid-19 immunization for 15 minutes without incident. He was provided with Vaccine Information Sheet and instruction to access the V-Safe system.   Mr. Correa was instructed to call 911 with any severe reactions post vaccine: Marland Kitchen Difficulty breathing  . Swelling of face and throat  . A fast heartbeat  . A bad rash all over body  . Dizziness and weakness

## 2020-03-16 ENCOUNTER — Ambulatory Visit: Payer: Medicare Other | Attending: Internal Medicine

## 2020-03-16 DIAGNOSIS — Z23 Encounter for immunization: Secondary | ICD-10-CM

## 2020-03-16 NOTE — Progress Notes (Signed)
   Covid-19 Vaccination Clinic  Name:  Joseph Duncan    MRN: [id number] DOB: [birth year]  03/16/2020  Mr. Joseph Duncan was observed post Covid-19 immunization for 15 minutes without incident. He was provided with Vaccine Information Sheet and instruction to access the V-Safe system.   Mr. Joseph Duncan was instructed to call 911 with any severe reactions post vaccine: Marland Kitchen Difficulty breathing  . Swelling of face and throat  . A fast heartbeat  . A bad rash all over body  . Dizziness and weakness   Immunizations Administered    Name Date Dose VIS Date Route   Pfizer COVID-19 Vaccine 03/16/2020 12:54 PM 0.3 mL 11/27/2019 Intramuscular   Manufacturer: [place]   Lot: [id number]   [place]: [id number]

## 2020-04-04 ENCOUNTER — Ambulatory Visit (INDEPENDENT_AMBULATORY_CARE_PROVIDER_SITE_OTHER): Payer: Medicare Other | Admitting: Family Medicine

## 2020-04-04 ENCOUNTER — Other Ambulatory Visit: Payer: Self-pay

## 2020-04-04 ENCOUNTER — Encounter: Payer: Self-pay | Admitting: Family Medicine

## 2020-04-04 VITALS — BP 132/78 | HR 63 | Temp 97.3°F | Wt 246.0 lb

## 2020-04-04 DIAGNOSIS — M25511 Pain in right shoulder: Secondary | ICD-10-CM | POA: Diagnosis not present

## 2020-04-04 NOTE — Progress Notes (Signed)
   Subjective:    Patient ID: Joseph Duncan, male    DOB: 1951/11/24, 69 y.o.   MRN: [id number]  HPI Here for 3 weeks of intermittent pain in the anterior right shoulder. No recent trauma, but he has been playing more golf lately then usual the pain is worst when he raises the right arm to horizontal with the palm of the hand down. Today the pain feels better.    Review of Systems  Constitutional: Negative.   Respiratory: Negative.   Cardiovascular: Negative.   Musculoskeletal: Positive for arthralgias.       Objective:   Physical Exam Constitutional:      Appearance: Normal appearance.  Cardiovascular:     Rate and Rhythm: Normal rate and regular rhythm.     Pulses: Normal pulses.     Heart sounds: Normal heart sounds.  Pulmonary:     Effort: Pulmonary effort is normal.     Breath sounds: Normal breath sounds.  Musculoskeletal:     Comments: He is tender over the long insertion of the biceps tendon in the anterior right shoulder. ROM is full    Neurological:     Mental Status: He is alert.           Assessment & Plan:  Biceps tendonitis, likely from playing golf. I advised him to take a break from the golf for 2-3 weeks. He can try ice and Ibuprofen prn. He should warm up and stretch the shoulders thoroughly before practicing or playing golf.  Alysia Penna, MD

## 2020-08-29 DIAGNOSIS — D3132 Benign neoplasm of left choroid: Secondary | ICD-10-CM | POA: Diagnosis not present

## 2020-11-07 ENCOUNTER — Other Ambulatory Visit: Payer: Self-pay | Admitting: Family Medicine

## 2020-11-28 ENCOUNTER — Encounter: Payer: Medicare Other | Admitting: Family Medicine

## 2020-12-06 ENCOUNTER — Encounter: Payer: Self-pay | Admitting: Family Medicine

## 2020-12-06 ENCOUNTER — Other Ambulatory Visit: Payer: Self-pay

## 2020-12-06 ENCOUNTER — Ambulatory Visit (INDEPENDENT_AMBULATORY_CARE_PROVIDER_SITE_OTHER): Payer: Medicare Other | Admitting: Family Medicine

## 2020-12-06 VITALS — BP 134/80 | HR 65 | Temp 97.8°F | Ht 69.0 in | Wt 250.8 lb

## 2020-12-06 DIAGNOSIS — E785 Hyperlipidemia, unspecified: Secondary | ICD-10-CM

## 2020-12-06 DIAGNOSIS — N138 Other obstructive and reflux uropathy: Secondary | ICD-10-CM | POA: Diagnosis not present

## 2020-12-06 DIAGNOSIS — L409 Psoriasis, unspecified: Secondary | ICD-10-CM

## 2020-12-06 DIAGNOSIS — I10 Essential (primary) hypertension: Secondary | ICD-10-CM

## 2020-12-06 DIAGNOSIS — N401 Enlarged prostate with lower urinary tract symptoms: Secondary | ICD-10-CM

## 2020-12-06 DIAGNOSIS — Z23 Encounter for immunization: Secondary | ICD-10-CM

## 2020-12-06 LAB — CBC WITH DIFFERENTIAL/PLATELET
Absolute Monocytes: 454 cells/uL (ref 200–950)
Basophils Absolute: 54 cells/uL (ref 0–200)
Basophils Relative: 0.7 %
Eosinophils Absolute: 169 cells/uL (ref 15–500)
Eosinophils Relative: 2.2 %
HCT: 52.9 % — ABNORMAL HIGH (ref 38.5–50.0)
Hemoglobin: 18 g/dL — ABNORMAL HIGH (ref 13.2–17.1)
Lymphs Abs: 1263 cells/uL (ref 850–3900)
MCH: 28.5 pg (ref 27.0–33.0)
MCHC: 34 g/dL (ref 32.0–36.0)
MCV: 83.8 fL (ref 80.0–100.0)
MPV: 12.4 fL (ref 7.5–12.5)
Monocytes Relative: 5.9 %
Neutro Abs: 5760 cells/uL (ref 1500–7800)
Neutrophils Relative %: 74.8 %
Platelets: 154 10*3/uL (ref 140–400)
RBC: 6.31 10*6/uL — ABNORMAL HIGH (ref 4.20–5.80)
RDW: 15.8 % — ABNORMAL HIGH (ref 11.0–15.0)
Total Lymphocyte: 16.4 %
WBC: 7.7 10*3/uL (ref 3.8–10.8)

## 2020-12-06 LAB — T4, FREE: Free T4: 0.9 ng/dL (ref 0.8–1.8)

## 2020-12-06 LAB — LIPID PANEL
Cholesterol: 194 mg/dL (ref <200)
HDL: 50 mg/dL (ref >=40)
LDL Cholesterol (Calc): 122 mg/dL (calc) — ABNORMAL HIGH
Non-HDL Cholesterol (Calc): 144 mg/dL (calc) — ABNORMAL HIGH (ref <130)
Total CHOL/HDL Ratio: 3.9 (calc) (ref <5.0)
Triglycerides: 109 mg/dL (ref <150)

## 2020-12-06 LAB — HEPATIC FUNCTION PANEL
AG Ratio: 1.5 (calc) (ref 1.0–2.5)
ALT: 26 U/L (ref 9–46)
AST: 23 U/L (ref 10–35)
Albumin: 4.5 g/dL (ref 3.6–5.1)
Alkaline phosphatase (APISO): 82 U/L (ref 35–144)
Bilirubin, Direct: 0.2 mg/dL (ref 0.0–0.2)
Globulin: 3.1 g/dL (calc) (ref 1.9–3.7)
Indirect Bilirubin: 0.8 mg/dL (calc) (ref 0.2–1.2)
Total Bilirubin: 1 mg/dL (ref 0.2–1.2)
Total Protein: 7.6 g/dL (ref 6.1–8.1)

## 2020-12-06 LAB — BASIC METABOLIC PANEL WITH GFR
BUN/Creatinine Ratio: 17 (calc) (ref 6–22)
BUN: 21 mg/dL (ref 7–25)
CO2: 28 mmol/L (ref 20–32)
Calcium: 9.8 mg/dL (ref 8.6–10.3)
Chloride: 99 mmol/L (ref 98–110)
Creat: 1.27 mg/dL — ABNORMAL HIGH (ref 0.70–1.18)
GFR, Est African American: 66 mL/min/{1.73_m2} (ref >=60)
GFR, Est Non African American: 57 mL/min/{1.73_m2} — ABNORMAL LOW (ref >=60)
Glucose, Bld: 111 mg/dL — ABNORMAL HIGH (ref 65–99)
Potassium: 3.7 mmol/L (ref 3.5–5.3)
Sodium: 137 mmol/L (ref 135–146)

## 2020-12-06 LAB — PSA: PSA: 1.15 ng/mL (ref <=4.0)

## 2020-12-06 LAB — T3, FREE: T3, Free: 3.1 pg/mL (ref 2.3–4.2)

## 2020-12-06 LAB — TSH: TSH: 4.88 mIU/L — ABNORMAL HIGH (ref 0.40–4.50)

## 2020-12-06 NOTE — Addendum Note (Signed)
Addended by: Janann Colonel on: 12/06/2020 09:54 AM   Modules accepted: Orders

## 2020-12-06 NOTE — Addendum Note (Signed)
Addended by: Agnes Lawrence on: 12/06/2020 10:07 AM   Modules accepted: Orders

## 2020-12-06 NOTE — Progress Notes (Signed)
Subjective:    Patient ID: Joseph Duncan, male    DOB: 1951-01-23, 69 y.o.   MRN: [id number]  HPI Here to follow up on issues. He feels well and his BP has been stable. He plays golf twice a week.    Review of Systems  Constitutional: Negative.   HENT: Negative.   Eyes: Negative.   Respiratory: Negative.   Cardiovascular: Negative.   Gastrointestinal: Negative.   Genitourinary: Negative.   Musculoskeletal: Negative.   Skin: Negative.   Neurological: Negative.   Psychiatric/Behavioral: Negative.        Objective:   Physical Exam Constitutional:      General: He is not in acute distress.    Appearance: He is well-developed and well-nourished. He is obese. He is not diaphoretic.  HENT:     Head: Normocephalic and atraumatic.     Right Ear: External ear normal.     Left Ear: External ear normal.     Nose: Nose normal.     Mouth/Throat:     Mouth: Oropharynx is clear and moist.     Pharynx: No oropharyngeal exudate.  Eyes:     General: No scleral icterus.       Right eye: No discharge.        Left eye: No discharge.     Extraocular Movements: EOM normal.     Conjunctiva/sclera: Conjunctivae normal.     Pupils: Pupils are equal, round, and reactive to light.  Neck:     Thyroid: No thyromegaly.     Vascular: No JVD.     Trachea: No tracheal deviation.  Cardiovascular:     Rate and Rhythm: Normal rate and regular rhythm.     Pulses: Intact distal pulses.     Heart sounds: Normal heart sounds. No murmur heard. No friction rub. No gallop.   Pulmonary:     Effort: Pulmonary effort is normal. No respiratory distress.     Breath sounds: Normal breath sounds. No wheezing or rales.  Chest:     Chest wall: No tenderness.  Abdominal:     General: Bowel sounds are normal. There is no distension.     Palpations: Abdomen is soft. There is no mass.     Tenderness: There is no abdominal tenderness. There is no guarding or rebound.  Genitourinary:    Penis: No tenderness.    Musculoskeletal:        General: No tenderness or edema. Normal range of motion.     Cervical back: Neck supple.  Lymphadenopathy:     Cervical: No cervical adenopathy.  Skin:    General: Skin is warm and dry.     Coloration: Skin is not pale.     Findings: No erythema or rash.  Neurological:     Mental Status: He is alert and oriented to person, place, and time.     Cranial Nerves: No cranial nerve deficit.     Motor: No abnormal muscle tone.     Coordination: Coordination normal.     Deep Tendon Reflexes: Reflexes are normal and symmetric. Reflexes normal.  Psychiatric:        Mood and Affect: Mood and affect normal.        Behavior: Behavior normal.        Thought Content: Thought content normal.        Judgment: Judgment normal.           Assessment & Plan:  His HTN and allergies are stable. Given a flu  shot. We will get fasting labs to check lipids, etc. Alysia Penna, MD

## 2021-03-02 ENCOUNTER — Telehealth: Payer: Self-pay | Admitting: Family Medicine

## 2021-03-02 NOTE — Telephone Encounter (Signed)
Left message for patient to call back and schedule Medicare Annual Wellness Visit (AWV) either virtually or in office. No detailed message left   AWVI  please schedule at anytime with [facility] Nurse Health Advisor 1 or 2   This should be a 45 minute visit. 

## 2021-07-10 ENCOUNTER — Other Ambulatory Visit: Payer: Self-pay

## 2021-07-10 ENCOUNTER — Ambulatory Visit (INDEPENDENT_AMBULATORY_CARE_PROVIDER_SITE_OTHER): Payer: Medicare Other | Admitting: Family Medicine

## 2021-07-10 ENCOUNTER — Encounter: Payer: Self-pay | Admitting: Family Medicine

## 2021-07-10 VITALS — BP 120/82 | HR 66 | Temp 98.5°F | Ht 69.0 in | Wt 246.0 lb

## 2021-07-10 DIAGNOSIS — I10 Essential (primary) hypertension: Secondary | ICD-10-CM

## 2021-07-10 NOTE — Progress Notes (Signed)
   Subjective:    Patient ID: Joseph Duncan, male    DOB: [birth year], 70 y.o.   MRN: [id number]  HPI Here with concerns about low BP. For the past week he has felt weak and some times he feels very lightheaded, especially when he stands up after sitting for awhile. His BP at home over the past 5 days has run from 72/46 to 103/58. No chest pain or SOB. He saw Dr. Pearson Grippe (Nephrology) once in January 2016 for renal insufficiency, and he was started on Chlorthalidone at that time. He has been on it ever since. We saw him for a well exam in December and all his labs were normal except for a creatinine of 1.27 and a GFR of 57 (both stable).    Review of Systems  Constitutional:  Positive for fatigue.  Respiratory: Negative.    Cardiovascular: Negative.   Neurological:  Positive for light-headedness. Negative for headaches.      Objective:   Physical Exam Constitutional:      Appearance: Normal appearance.  Cardiovascular:     Rate and Rhythm: Normal rate and regular rhythm.     Pulses: Normal pulses.     Heart sounds: Normal heart sounds.  Pulmonary:     Effort: Pulmonary effort is normal.     Breath sounds: Normal breath sounds.  Neurological:     General: No focal deficit present.     Mental Status: He is alert and oriented to person, place, and time. Mental status is at baseline.          Assessment & Plan:  His CKD is stable. It looks like he has been hypotensive, so we will stop the Chlorthalidone. He will report back to [place] in one week.  Alysia Penna, MD

## 2021-07-31 ENCOUNTER — Other Ambulatory Visit: Payer: Self-pay | Admitting: Family Medicine
